# Patient Record
Sex: Female | Born: 1940 | Race: White | Hispanic: No | Marital: Married | Smoking: Never smoker
Health system: Southern US, Community
[De-identification: ages and names within clinical notes are randomized; demographics above are authoritative.]

## PROBLEM LIST (undated history)

## (undated) DIAGNOSIS — I10 Essential (primary) hypertension: Secondary | ICD-10-CM

## (undated) DIAGNOSIS — C4490 Unspecified malignant neoplasm of skin, unspecified: Secondary | ICD-10-CM

## (undated) HISTORY — PX: CHOLECYSTECTOMY: SHX55

## (undated) HISTORY — PX: ABDOMINAL HYSTERECTOMY: SHX81

---

## 1997-10-06 ENCOUNTER — Other Ambulatory Visit: Admission: RE | Admit: 1997-10-06 | Discharge: 1997-10-06 | Payer: Self-pay | Admitting: Gynecology

## 1997-10-14 ENCOUNTER — Other Ambulatory Visit: Admission: RE | Admit: 1997-10-14 | Discharge: 1997-10-14 | Payer: Self-pay | Admitting: Gynecology

## 1998-10-27 ENCOUNTER — Other Ambulatory Visit: Admission: RE | Admit: 1998-10-27 | Discharge: 1998-10-27 | Payer: Self-pay | Admitting: Gynecology

## 1998-12-18 ENCOUNTER — Inpatient Hospital Stay (HOSPITAL_COMMUNITY): Admission: RE | Admit: 1998-12-18 | Discharge: 1998-12-19 | Payer: Self-pay | Admitting: Gynecology

## 2000-02-21 ENCOUNTER — Emergency Department (HOSPITAL_COMMUNITY): Admission: EM | Admit: 2000-02-21 | Discharge: 2000-02-21 | Payer: Self-pay

## 2000-02-29 ENCOUNTER — Observation Stay (HOSPITAL_COMMUNITY): Admission: RE | Admit: 2000-02-29 | Discharge: 2000-03-01 | Payer: Self-pay | Admitting: Surgery

## 2000-02-29 ENCOUNTER — Encounter: Payer: Self-pay | Admitting: Surgery

## 2001-12-17 ENCOUNTER — Other Ambulatory Visit: Admission: RE | Admit: 2001-12-17 | Discharge: 2001-12-17 | Payer: Self-pay | Admitting: Gynecology

## 2002-12-27 ENCOUNTER — Other Ambulatory Visit: Admission: RE | Admit: 2002-12-27 | Discharge: 2002-12-27 | Payer: Self-pay | Admitting: Gynecology

## 2005-04-01 ENCOUNTER — Encounter: Admission: RE | Admit: 2005-04-01 | Discharge: 2005-04-01 | Payer: Self-pay | Admitting: Internal Medicine

## 2005-04-17 ENCOUNTER — Ambulatory Visit: Payer: Self-pay | Admitting: Internal Medicine

## 2005-04-29 ENCOUNTER — Ambulatory Visit: Payer: Self-pay | Admitting: Internal Medicine

## 2006-03-28 ENCOUNTER — Encounter: Admission: RE | Admit: 2006-03-28 | Discharge: 2006-03-28 | Payer: Self-pay | Admitting: Internal Medicine

## 2008-06-08 ENCOUNTER — Encounter: Payer: Self-pay | Admitting: Internal Medicine

## 2010-06-16 ENCOUNTER — Encounter: Payer: Self-pay | Admitting: Internal Medicine

## 2012-10-28 ENCOUNTER — Other Ambulatory Visit: Payer: Self-pay | Admitting: Internal Medicine

## 2012-10-28 DIAGNOSIS — R109 Unspecified abdominal pain: Secondary | ICD-10-CM

## 2012-11-02 ENCOUNTER — Ambulatory Visit
Admission: RE | Admit: 2012-11-02 | Discharge: 2012-11-02 | Disposition: A | Discharge status: Home / Self Care | Payer: PRIVATE HEALTH INSURANCE | Source: Ambulatory Visit | Attending: Internal Medicine | Admitting: Internal Medicine

## 2012-11-02 DIAGNOSIS — R109 Unspecified abdominal pain: Secondary | ICD-10-CM

## 2013-01-22 ENCOUNTER — Encounter: Payer: Self-pay | Admitting: Internal Medicine

## 2020-04-02 ENCOUNTER — Other Ambulatory Visit: Payer: Self-pay

## 2020-04-02 ENCOUNTER — Emergency Department (HOSPITAL_COMMUNITY): Payer: Medicare Other

## 2020-04-02 ENCOUNTER — Inpatient Hospital Stay (HOSPITAL_COMMUNITY)
Admission: EM | Admit: 2020-04-02 | Discharge: 2020-04-08 | DRG: 392 | Disposition: A | Discharge status: Home / Self Care | Payer: Medicare Other | Attending: General Surgery | Admitting: General Surgery

## 2020-04-02 ENCOUNTER — Encounter (HOSPITAL_COMMUNITY): Payer: Self-pay

## 2020-04-02 DIAGNOSIS — E876 Hypokalemia: Secondary | ICD-10-CM | POA: Diagnosis not present

## 2020-04-02 DIAGNOSIS — K5720 Diverticulitis of large intestine with perforation and abscess without bleeding: Secondary | ICD-10-CM | POA: Diagnosis present

## 2020-04-02 DIAGNOSIS — Z20822 Contact with and (suspected) exposure to covid-19: Secondary | ICD-10-CM | POA: Diagnosis present

## 2020-04-02 DIAGNOSIS — Z800 Family history of malignant neoplasm of digestive organs: Secondary | ICD-10-CM | POA: Diagnosis not present

## 2020-04-02 DIAGNOSIS — R1032 Left lower quadrant pain: Secondary | ICD-10-CM | POA: Diagnosis present

## 2020-04-02 DIAGNOSIS — Z881 Allergy status to other antibiotic agents status: Secondary | ICD-10-CM

## 2020-04-02 DIAGNOSIS — I10 Essential (primary) hypertension: Secondary | ICD-10-CM | POA: Diagnosis present

## 2020-04-02 HISTORY — DX: Essential (primary) hypertension: I10

## 2020-04-02 LAB — COMPREHENSIVE METABOLIC PANEL
ALT: 23 U/L (ref 0–44)
AST: 27 U/L (ref 15–41)
Albumin: 4.6 g/dL (ref 3.5–5.0)
Alkaline Phosphatase: 61 U/L (ref 38–126)
Anion gap: 10 (ref 5–15)
BUN: 14 mg/dL (ref 8–23)
CO2: 21 mmol/L — ABNORMAL LOW (ref 22–32)
Calcium: 9.4 mg/dL (ref 8.9–10.3)
Chloride: 100 mmol/L (ref 98–111)
Creatinine, Ser: 1.07 mg/dL — ABNORMAL HIGH (ref 0.44–1.00)
GFR, Estimated: 53 mL/min — ABNORMAL LOW (ref >60)
Glucose, Bld: 121 mg/dL — ABNORMAL HIGH (ref 70–99)
Potassium: 4.5 mmol/L (ref 3.5–5.1)
Sodium: 131 mmol/L — ABNORMAL LOW (ref 135–145)
Total Bilirubin: 1.6 mg/dL — ABNORMAL HIGH (ref 0.3–1.2)
Total Protein: 7.5 g/dL (ref 6.5–8.1)

## 2020-04-02 LAB — CBC
HCT: 44.7 % (ref 36.0–46.0)
Hemoglobin: 14.7 g/dL (ref 12.0–15.0)
MCH: 31.1 pg (ref 26.0–34.0)
MCHC: 32.9 g/dL (ref 30.0–36.0)
MCV: 94.5 fL (ref 80.0–100.0)
Platelets: 265 10*3/uL (ref 150–400)
RBC: 4.73 MIL/uL (ref 3.87–5.11)
RDW: 13.3 % (ref 11.5–15.5)
WBC: 20.2 10*3/uL — ABNORMAL HIGH (ref 4.0–10.5)
nRBC: 0 % (ref 0.0–0.2)

## 2020-04-02 LAB — URINALYSIS, ROUTINE W REFLEX MICROSCOPIC
Bacteria, UA: NONE SEEN
Bilirubin Urine: NEGATIVE
Glucose, UA: NEGATIVE mg/dL
Hgb urine dipstick: NEGATIVE
Ketones, ur: NEGATIVE mg/dL
Nitrite: NEGATIVE
Protein, ur: NEGATIVE mg/dL
Specific Gravity, Urine: 1.005 (ref 1.005–1.030)
pH: 7 (ref 5.0–8.0)

## 2020-04-02 LAB — RESPIRATORY PANEL BY RT PCR (FLU A&B, COVID)
Influenza A by PCR: NEGATIVE
Influenza B by PCR: NEGATIVE
SARS Coronavirus 2 by RT PCR: NEGATIVE

## 2020-04-02 LAB — LACTIC ACID, PLASMA: Lactic Acid, Venous: 0.9 mmol/L (ref 0.5–1.9)

## 2020-04-02 LAB — LIPASE, BLOOD: Lipase: 54 U/L — ABNORMAL HIGH (ref 11–51)

## 2020-04-02 MED ORDER — ONDANSETRON HCL 4 MG/2ML IJ SOLN
4.0000 mg | Freq: Once | INTRAMUSCULAR | Status: AC
  Administered 2020-04-02: 4 mg via INTRAVENOUS
  Filled 2020-04-02: qty 2

## 2020-04-02 MED ORDER — FENTANYL CITRATE (PF) 100 MCG/2ML IJ SOLN
50.0000 ug | Freq: Once | INTRAMUSCULAR | Status: AC
  Administered 2020-04-02: 50 ug via INTRAVENOUS
  Filled 2020-04-02: qty 2

## 2020-04-02 MED ORDER — SODIUM CHLORIDE 0.9 % IV BOLUS
1000.0000 mL | Freq: Once | INTRAVENOUS | Status: AC
  Administered 2020-04-02: 1000 mL via INTRAVENOUS

## 2020-04-02 MED ORDER — DIPHENHYDRAMINE HCL 50 MG/ML IJ SOLN
25.0000 mg | Freq: Once | INTRAMUSCULAR | Status: DC
  Filled 2020-04-02: qty 1

## 2020-04-02 MED ORDER — KCL IN DEXTROSE-NACL 20-5-0.9 MEQ/L-%-% IV SOLN
INTRAVENOUS | Status: DC
  Filled 2020-04-02 (×9): qty 1000

## 2020-04-02 MED ORDER — AMLODIPINE BESYLATE 5 MG PO TABS
5.0000 mg | ORAL_TABLET | Freq: Every day | ORAL | Status: DC
  Administered 2020-04-07 – 2020-04-08 (×2): 5 mg via ORAL
  Filled 2020-04-02 (×5): qty 1

## 2020-04-02 MED ORDER — IOHEXOL 300 MG/ML  SOLN
100.0000 mL | Freq: Once | INTRAMUSCULAR | Status: AC | PRN
  Administered 2020-04-02: 100 mL via INTRAVENOUS

## 2020-04-02 MED ORDER — PIPERACILLIN-TAZOBACTAM 3.375 G IVPB
3.3750 g | Freq: Three times a day (TID) | INTRAVENOUS | Status: DC
  Administered 2020-04-02 – 2020-04-08 (×17): 3.375 g via INTRAVENOUS
  Filled 2020-04-02 (×17): qty 50

## 2020-04-02 MED ORDER — ONDANSETRON HCL 4 MG/2ML IJ SOLN: 4.0000 mg | Freq: Four times a day (QID) | INTRAMUSCULAR | Status: DC | PRN

## 2020-04-02 MED ORDER — ONDANSETRON 4 MG PO TBDP: 4.0000 mg | ORAL_TABLET | Freq: Four times a day (QID) | ORAL | Status: DC | PRN

## 2020-04-02 MED ORDER — SODIUM CHLORIDE (PF) 0.9 % IJ SOLN
INTRAMUSCULAR | Status: AC
  Filled 2020-04-02: qty 50

## 2020-04-02 MED ORDER — PIPERACILLIN-TAZOBACTAM 3.375 G IVPB 30 MIN
3.3750 g | Freq: Once | INTRAVENOUS | Status: AC
  Administered 2020-04-02: 3.375 g via INTRAVENOUS
  Filled 2020-04-02: qty 50

## 2020-04-02 MED ORDER — MORPHINE SULFATE (PF) 2 MG/ML IV SOLN
1.0000 mg | INTRAVENOUS | Status: DC | PRN
  Administered 2020-04-02 – 2020-04-03 (×5): 2 mg via INTRAVENOUS
  Filled 2020-04-02 (×6): qty 1

## 2020-04-02 MED ORDER — SODIUM CHLORIDE 0.9 % IV BOLUS
500.0000 mL | Freq: Once | INTRAVENOUS | Status: AC
  Administered 2020-04-02: 500 mL via INTRAVENOUS

## 2020-04-02 MED ORDER — PANTOPRAZOLE SODIUM 40 MG IV SOLR
40.0000 mg | Freq: Every day | INTRAVENOUS | Status: DC
  Administered 2020-04-02 – 2020-04-04 (×3): 40 mg via INTRAVENOUS
  Filled 2020-04-02 (×3): qty 40

## 2020-04-02 NOTE — ED Notes (Signed)
Dr Toth at bedside. 

## 2020-04-02 NOTE — Progress Notes (Signed)
Patient ID: Sharon Callahan, female   DOB: 09/16/1940, 79 y.o.   MRN: [id number] Pt states she feels a little more comfortable. HR improving. On exam her abd is soft on right with occasional guarding. Very tender on left. Continue abx and monitor very closely

## 2020-04-02 NOTE — H&P (Signed)
Sharon Callahan is an 79 y.o. female.   Chief Complaint: abd pain HPI: The patient is a 79 year old white female who began having abdominal pain acutely last night.  The pain is mostly located in her left lower quadrant.  She did have a fever overnight to 103 at home.  She denies any nausea or vomiting.  She did have some loose stools yesterday.  She came to the emergency department today where a CT scan shows a couple dots of free air but no real free fluid in the abdomen.  There appears to be some inflammatory change around her proximal sigmoid colon.  Past Medical History:  Diagnosis Date  . Hypertension     Past Surgical History:  Procedure Laterality Date  . ABDOMINAL HYSTERECTOMY    . CHOLECYSTECTOMY      History reviewed. No pertinent family history. Social History:  reports that she has never smoked. She has never used smokeless tobacco. She reports that she does not drink alcohol and does not use drugs.  Allergies:  Allergies  Allergen Reactions  . Ciprofloxacin     (Not in a hospital admission)   Results for orders placed or performed during the hospital encounter of 04/02/20 (from the past 48 hour(s))  Lipase, blood     Status: Abnormal   Collection Time: 04/02/20  9:16 AM  Result Value Ref Range   Lipase 54 (H) 11 - 51 U/L    Comment: Performed at [facility], [address] [place], [place] [address]  Comprehensive metabolic panel     Status: Abnormal   Collection Time: 04/02/20  9:16 AM  Result Value Ref Range   Sodium 131 (L) 135 - 145 mmol/L   Potassium 4.5 3.5 - 5.1 mmol/L   Chloride 100 98 - 111 mmol/L   CO2 21 (L) 22 - 32 mmol/L   Glucose, Bld 121 (H) 70 - 99 mg/dL    Comment: Glucose reference range applies only to samples taken after fasting for at least 8 hours.   BUN 14 8 - 23 mg/dL   Creatinine, Ser 3.55 (H) 0.44 - 1.00 mg/dL   Calcium 9.4 8.9 - 73.2 mg/dL   Total Protein 7.5 6.5 - 8.1 g/dL   Albumin 4.6 3.5 - 5.0 g/dL   AST  27 15 - 41 U/L   ALT 23 0 - 44 U/L   Alkaline Phosphatase 61 38 - 126 U/L   Total Bilirubin 1.6 (H) 0.3 - 1.2 mg/dL   GFR, Estimated 53 (L) >60 mL/min    Comment: (NOTE) Calculated using the CKD-EPI Creatinine Equation (2021)    Anion gap 10 5 - 15    Comment: Performed at [facility], [address], [place], [place] [address]  CBC     Status: Abnormal   Collection Time: 04/02/20  9:16 AM  Result Value Ref Range   WBC 20.2 (H) 4.0 - 10.5 K/uL   RBC 4.73 3.87 - 5.11 MIL/uL   Hemoglobin 14.7 12.0 - 15.0 g/dL   HCT 27.0 36 - 46 %   MCV 94.5 80.0 - 100.0 fL   MCH 31.1 26.0 - 34.0 pg   MCHC 32.9 30.0 - 36.0 g/dL   RDW 62.3 76.2 - 83.1 %   Platelets 265 150 - 400 K/uL   nRBC 0.0 0.0 - 0.2 %    Comment: Performed at [facility], [address], [place], [place] [address]  Urinalysis, Routine w reflex microscopic Urine, Clean  Catch     Status: Abnormal   Collection Time: 04/02/20  9:16 AM  Result Value Ref Range   Color, Urine STRAW (A) YELLOW   APPearance CLEAR CLEAR   Specific Gravity, Urine 1.005 1.005 - 1.030   pH 7.0 5.0 - 8.0   Glucose, UA NEGATIVE NEGATIVE mg/dL   Hgb urine dipstick NEGATIVE NEGATIVE   Bilirubin Urine NEGATIVE NEGATIVE   Ketones, ur NEGATIVE NEGATIVE mg/dL   Protein, ur NEGATIVE NEGATIVE mg/dL   Nitrite NEGATIVE NEGATIVE   Leukocytes,Ua TRACE (A) NEGATIVE   RBC / HPF 0-5 0 - 5 RBC/hpf   WBC, UA 0-5 0 - 5 WBC/hpf   Bacteria, UA NONE SEEN NONE SEEN   Squamous Epithelial / LPF 0-5 0 - 5    Comment: Performed at [facility], [address] [place], [place] [address]  Lactic acid, plasma     Status: None   Collection Time: 04/02/20  9:58 AM  Result Value Ref Range   Lactic Acid, Venous 0.9 0.5 - 1.9 mmol/L    Comment: Performed at [facility], [address], [place], [place] [address]   CT ABDOMEN PELVIS W CONTRAST  Result Date: 04/02/2020 CLINICAL DATA:  Lower abdominal  pain since last night. EXAM: CT ABDOMEN AND PELVIS WITH CONTRAST TECHNIQUE: Multidetector CT imaging of the abdomen and pelvis was performed using the standard protocol following bolus administration of intravenous contrast. CONTRAST:  OMNIPAQUE IOHEXOL 300 MG/ML  SOLN COMPARISON:  CT abdomen pelvis 02/17/2014 FINDINGS: Lower chest: Small hiatal hernia. Hepatobiliary: No focal liver abnormality is seen. Status post cholecystectomy. No biliary dilatation. Pancreas: Unremarkable. No pancreatic ductal dilatation or surrounding inflammatory changes. Spleen: Normal in size without focal abnormality. Adrenals/Urinary Tract: Adrenal glands are unremarkable. Kidneys are normal, without renal calculi, focal lesion, or hydronephrosis. Bladder is unremarkable. Stomach/Bowel: Stomach is within normal limits. Appendix appears normal. There are multiple colonic diverticula and fat stranding about the sigmoid colon in the left lower quadrant. There are adjacent small foci of air as well as small volume distant free air in the upper abdomen. No pericolonic fluid collection. No significant bowel wall thickening. Vascular/Lymphatic: Aortic atherosclerosis. No enlarged abdominal or pelvic lymph nodes. Reproductive: Status post hysterectomy. No adnexal masses. Other: No abdominal wall hernia or abnormality. Musculoskeletal: Grade 1 anterolisthesis of L5 on S1 similar in appearance to the prior study and likely on a degenerative basis. IMPRESSION: 1. There is small volume free air in the abdomen consistent with perforation, most likely from a perforated diverticulum in the sigmoid colon give there is fat stranding and adjacent foci of gas in the left lower quadrant. 2.  Aortic atherosclerosis. Aortic Atherosclerosis (ICD10-I70.0). These results were called by telephone at the time of interpretation on 04/02/2020 at 11:25 am to provider [facility] , who verbally acknowledged these results. Electronically Signed   By: Emmaline Kluver M.D.   On: 04/02/2020 11:25    Review of Systems  Constitutional: Positive for fever.  HENT: Negative.   Eyes: Negative.   Respiratory: Negative.   Cardiovascular: Negative.   Gastrointestinal: Positive for abdominal pain and diarrhea. Negative for vomiting.  Endocrine: Negative.   Genitourinary: Negative.   Musculoskeletal: Negative.   Skin: Negative.   Allergic/Immunologic: Negative.   Neurological: Negative.   Hematological: Negative.   Psychiatric/Behavioral: Negative.     Blood pressure 124/79, pulse 94, temperature 98.2 F (36.8 C), temperature source Oral, resp. rate 16, SpO2 93 %. Physical Exam Constitutional:  General: She is not in acute distress.    Appearance: Normal appearance. She is normal weight.  HENT:     Head: Normocephalic and atraumatic.     Right Ear: External ear normal.     Left Ear: External ear normal.     Nose: Nose normal.     Mouth/Throat:     Mouth: Mucous membranes are moist.     Pharynx: Oropharynx is clear.  Eyes:     General: No scleral icterus.    Extraocular Movements: Extraocular movements intact.     Conjunctiva/sclera: Conjunctivae normal.     Pupils: Pupils are equal, round, and reactive to light.  Cardiovascular:     Rate and Rhythm: Regular rhythm. Tachycardia present.     Pulses: Normal pulses.     Heart sounds: Normal heart sounds.     Comments: No pitting edema lower extr Pulmonary:     Effort: Pulmonary effort is normal. No respiratory distress.     Breath sounds: Normal breath sounds.  Abdominal:     General: Abdomen is flat.     Tenderness: There is abdominal tenderness.     Comments: abd seems soft on right side but there is focal severe tenderness LLQ  Musculoskeletal:        General: No tenderness or deformity. Normal range of motion.     Cervical back: Normal range of motion. No tenderness.  Lymphadenopathy:     Cervical: No cervical adenopathy.  Skin:    General: Skin is warm and dry.      Coloration: Skin is not jaundiced.     Findings: No rash.  Neurological:     General: No focal deficit present.     Mental Status: She is alert and oriented to person, place, and time.  Psychiatric:        Mood and Affect: Mood normal.        Behavior: Behavior normal.        Thought Content: Thought content normal.      Assessment/Plan The patient appears to have a perforated sigmoid colon diverticulitis.  Since she does not have a lot of inflammatory change around the colon or fluid in the abdomen and because the right side of her abdomen seems soft I think it would be reasonable to try to treat her conservatively with broad-spectrum antibiotic therapy and bowel rest and watch her closely.  If she improves then she may avoid an acute surgery which would involve a colostomy.  If she does not improve then she may have to go to the operating room more urgently for a partial colectomy with colostomy.  I have discussed this in detail with her and she understands and agrees with this treatment plan.  She would also like to avoid surgery if at all possible.  Chevis Pretty III, MD 04/02/2020, 12:37 PM

## 2020-04-02 NOTE — Plan of Care (Signed)
Goals initiated  

## 2020-04-02 NOTE — ED Provider Notes (Signed)
[facility]-EMERGENCY DEPT Provider Note   CSN: [id number] Arrival date & time: 04/02/20  0825     History Chief Complaint  Patient presents with  . Abdominal Pain    Sharon Callahan is a 79 y.o. female.  Sharon Callahan is a 79 y.o. female with a history of hypertension, otherwise healthy, who presents to the ED for evaluation of left lower quadrant abdominal pain.  This pain began suddenly around 10 PM last night.  She reports since then it has been constant and worsening.  Pain kept her up most of the night.  She had 2 episodes of vomiting starting at 3 AM.  She states that yesterday during the day she began having some loose stools and diarrhea has continued today.  She denies any melena or hematochezia.  Reports chills throughout the night but no fevers noted.  She has never had similar pain.  Denies history of diverticulitis.  Has had previous cholecystectomy and abdominal hysterectomy, denies other abdominal surgeries.  No chest pain or shortness of breath, no cough, no recent illness.  No meds prior to arrival.  No other aggravating or alleviating factors.        Past Medical History:  Diagnosis Date  . Hypertension     There are no problems to display for this patient.   Past Surgical History:  Procedure Laterality Date  . ABDOMINAL HYSTERECTOMY    . CHOLECYSTECTOMY       OB History   No obstetric history on file.     History reviewed. No pertinent family history.  Social History   Tobacco Use  . Smoking status: Never Smoker  . Smokeless tobacco: Never Used  Substance Use Topics  . Alcohol use: Never  . Drug use: Never    Home Medications Prior to Admission medications   Medication Sig Start Date End Date Taking? Authorizing Provider  amLODipine (NORVASC) 5 MG tablet Take 5 mg by mouth daily. 03/07/20   [provider]  estradiol (ESTRACE) 0.5 MG tablet Take 0.5 mg by mouth daily. 03/06/20   [provider]   spironolactone (ALDACTONE) 25 MG tablet Take 25 mg by mouth daily. 03/12/20   [provider]    Allergies    Ciprofloxacin  Review of Systems   Review of Systems  Constitutional: Positive for chills. Negative for fever.  HENT: Negative.   Eyes: Negative for visual disturbance.  Respiratory: Negative for cough and shortness of breath.   Cardiovascular: Negative for chest pain.  Gastrointestinal: Positive for abdominal pain, diarrhea, nausea and vomiting. Negative for blood in stool and constipation.  Genitourinary: Negative for dysuria and frequency.  Musculoskeletal: Negative for arthralgias and myalgias.  Skin: Negative for color change and rash.  Neurological: Negative for dizziness, syncope and light-headedness.    Physical Exam Updated Vital Signs BP (!) 147/66 (BP Location: Left Arm)   Pulse 85   Temp 98.2 F (36.8 C) (Oral)   Resp 18   SpO2 99%   Physical Exam Vitals and nursing note reviewed.  Constitutional:      General: She is not in acute distress.    Appearance: She is well-developed. She is ill-appearing. She is not diaphoretic.     Comments: Patient appears uncomfortable and somewhat ill-appearing but in no acute distress  HENT:     Head: Normocephalic and atraumatic.     Mouth/Throat:     Mouth: Mucous membranes are moist.     Pharynx: Oropharynx is clear.  Eyes:  General:        Right eye: No discharge.        Left eye: No discharge.  Cardiovascular:     Rate and Rhythm: Normal rate and regular rhythm.     Heart sounds: Normal heart sounds.  Pulmonary:     Effort: Pulmonary effort is normal. No respiratory distress.     Breath sounds: Normal breath sounds. No wheezing or rales.     Comments: Respirations equal and unlabored, patient able to speak in full sentences, lungs clear to auscultation bilaterally Abdominal:     General: Bowel sounds are normal. There is no distension.     Palpations: Abdomen is soft. There is no mass.      Tenderness: There is abdominal tenderness in the left lower quadrant. There is guarding.     Comments: Abdomen is soft and nondistended, bowel sounds present throughout, patient is focally tender in the left lower quadrant, but with palpation in any other quadrant patient also experiences some pain in the left lower quadrant, she has guarding with tenderness.  Musculoskeletal:        General: No deformity.     Cervical back: Neck supple.  Skin:    General: Skin is warm and dry.     Capillary Refill: Capillary refill takes less than 2 seconds.  Neurological:     Mental Status: She is alert.     Coordination: Coordination normal.     Comments: Speech is clear, able to follow commands Moves extremities without ataxia, coordination intact  Psychiatric:        Mood and Affect: Mood normal.        Behavior: Behavior normal.     ED Results / Procedures / Treatments   Labs (all labs ordered are listed, but only abnormal results are displayed) Labs Reviewed  LIPASE, BLOOD - Abnormal; Notable for the following components:      Result Value   Lipase 54 (*)    All other components within normal limits  COMPREHENSIVE METABOLIC PANEL - Abnormal; Notable for the following components:   Sodium 131 (*)    CO2 21 (*)    Glucose, Bld 121 (*)    Creatinine, Ser 1.07 (*)    Total Bilirubin 1.6 (*)    GFR, Estimated 53 (*)    All other components within normal limits  CBC - Abnormal; Notable for the following components:   WBC 20.2 (*)    All other components within normal limits  URINALYSIS, ROUTINE W REFLEX MICROSCOPIC - Abnormal; Notable for the following components:   Color, Urine STRAW (*)    Leukocytes,Ua TRACE (*)    All other components within normal limits  RESPIRATORY PANEL BY RT PCR (FLU A&B, COVID)  LACTIC ACID, PLASMA  BASIC METABOLIC PANEL  CBC    EKG None  Radiology CT ABDOMEN PELVIS W CONTRAST  Result Date: 04/02/2020 CLINICAL DATA:  Lower abdominal pain since last  night. EXAM: CT ABDOMEN AND PELVIS WITH CONTRAST TECHNIQUE: Multidetector CT imaging of the abdomen and pelvis was performed using the standard protocol following bolus administration of intravenous contrast. CONTRAST:  OMNIPAQUE IOHEXOL 300 MG/ML  SOLN COMPARISON:  CT abdomen pelvis 02/17/2014 FINDINGS: Lower chest: Small hiatal hernia. Hepatobiliary: No focal liver abnormality is seen. Status post cholecystectomy. No biliary dilatation. Pancreas: Unremarkable. No pancreatic ductal dilatation or surrounding inflammatory changes. Spleen: Normal in size without focal abnormality. Adrenals/Urinary Tract: Adrenal glands are unremarkable. Kidneys are normal, without renal calculi, focal lesion, or hydronephrosis.  Bladder is unremarkable. Stomach/Bowel: Stomach is within normal limits. Appendix appears normal. There are multiple colonic diverticula and fat stranding about the sigmoid colon in the left lower quadrant. There are adjacent small foci of air as well as small volume distant free air in the upper abdomen. No pericolonic fluid collection. No significant bowel wall thickening. Vascular/Lymphatic: Aortic atherosclerosis. No enlarged abdominal or pelvic lymph nodes. Reproductive: Status post hysterectomy. No adnexal masses. Other: No abdominal wall hernia or abnormality. Musculoskeletal: Grade 1 anterolisthesis of L5 on S1 similar in appearance to the prior study and likely on a degenerative basis. IMPRESSION: 1. There is small volume free air in the abdomen consistent with perforation, most likely from a perforated diverticulum in the sigmoid colon give there is fat stranding and adjacent foci of gas in the left lower quadrant. 2.  Aortic atherosclerosis. Aortic Atherosclerosis (ICD10-I70.0). These results were called by telephone at the time of interpretation on 04/02/2020 at 11:25 am to provider [facility] , who verbally acknowledged these results. Electronically Signed   By: Emmaline Kluver M.D.   On:  04/02/2020 11:25    Procedures .Critical Care Performed by: Dartha Lodge, PA-C Authorized by: Dartha Lodge, PA-C   Critical care provider statement:    Critical care time (minutes):  45   Critical care time was exclusive of:  Separately billable procedures and treating other patients and teaching time   Critical care was necessary to treat or prevent imminent or life-threatening deterioration of the following conditions: Perforated diverticulitis.   Critical care was time spent personally by me on the following activities:  Discussions with consultants, evaluation of patient's response to treatment, examination of patient, ordering and performing treatments and interventions, ordering and review of laboratory studies, ordering and review of radiographic studies, pulse oximetry, re-evaluation of patient's condition, obtaining history from patient or surrogate and review of old charts   (including critical care time)  Medications Ordered in ED Medications  sodium chloride (PF) 0.9 % injection (has no administration in time range)  piperacillin-tazobactam (ZOSYN) IVPB 3.375 g (3.375 g Intravenous New Bag/Given 04/02/20 1139)  sodium chloride 0.9 % bolus 1,000 mL (0 mLs Intravenous Stopped 04/02/20 1139)  ondansetron (ZOFRAN) injection 4 mg (4 mg Intravenous Given 04/02/20 0957)  fentaNYL (SUBLIMAZE) injection 50 mcg (50 mcg Intravenous Given 04/02/20 0957)  iohexol (OMNIPAQUE) 300 MG/ML solution 100 mL (100 mLs Intravenous Contrast Given 04/02/20 1034)    ED Course  I have reviewed the triage vital signs and the nursing notes.  Pertinent labs & imaging results that were available during my care of the patient were reviewed by me and considered in my medical decision making (see chart for details).    MDM Rules/Calculators/A&P                         79 year old female presents with sudden onset left lower quadrant pain starting last night that has been severe and persistent associated  with vomiting and diarrhea, chills but no fever.  On arrival patient appears uncomfortable, but vitals are stable.  Differential includes diverticulitis, appendicitis, colitis, perforation, bowel obstruction.  Will evaluate with labs and CT, IV fluids, pain and nausea medication ordered.  I have independently ordered, reviewed and interpreted all labs and imaging: CBC: Significant leukocytosis of 20.2, normal hemoglobin CMP: Mild hyponatremia suggesting some dehydration, CO2 of 21, glucose 121, no other significant electrolyte derangements, creatinine of 1.07, mildly elevated bilirubin but no other significantly normal LFTs. Lipase:  Minimally elevated at 54 Lactic acid: Not elevated  Called by radiology regarding CT abdomen pelvis which shows perforation in the left lower quadrant most likely from diverticulitis, although there is not significant thickening of the bowel wall in this area there is some surrounding fat stranding and adjacent foci of gas.  Will start patient on IV Zosyn for perforation and consult general surgery.  Case discussed with Dr. Carolynne Edouardoth with general surgery who is reviewed patient's CT scan, agrees with Zosyn for antibiotic coverage and will be in to see and admit the patient.  Final Clinical Impression(s) / ED Diagnoses Final diagnoses:  Diverticulitis of colon with perforation    Rx / DC Orders ED Discharge Orders    None       Legrand RamsFord, Mckensey Berghuis N, PA-C 04/02/20 1858    Pollyann SavoySheldon, Charles B, MD 04/03/20 [phone]

## 2020-04-02 NOTE — ED Triage Notes (Signed)
Pt presents with c/o left lower quadrant pain since last night. Pt reports she did have 2 episodes of vomiting around 3 am.

## 2020-04-03 LAB — BASIC METABOLIC PANEL
Anion gap: 6 (ref 5–15)
BUN: 10 mg/dL (ref 8–23)
CO2: 20 mmol/L — ABNORMAL LOW (ref 22–32)
Calcium: 8.4 mg/dL — ABNORMAL LOW (ref 8.9–10.3)
Chloride: 109 mmol/L (ref 98–111)
Creatinine, Ser: 0.89 mg/dL (ref 0.44–1.00)
GFR, Estimated: >60 mL/min (ref >60)
Glucose, Bld: 118 mg/dL — ABNORMAL HIGH (ref 70–99)
Potassium: 4.2 mmol/L (ref 3.5–5.1)
Sodium: 135 mmol/L (ref 135–145)

## 2020-04-03 LAB — CBC
HCT: 37.5 % (ref 36.0–46.0)
Hemoglobin: 11.8 g/dL — ABNORMAL LOW (ref 12.0–15.0)
MCH: 30.7 pg (ref 26.0–34.0)
MCHC: 31.5 g/dL (ref 30.0–36.0)
MCV: 97.7 fL (ref 80.0–100.0)
Platelets: 212 10*3/uL (ref 150–400)
RBC: 3.84 MIL/uL — ABNORMAL LOW (ref 3.87–5.11)
RDW: 13.9 % (ref 11.5–15.5)
WBC: 12.9 10*3/uL — ABNORMAL HIGH (ref 4.0–10.5)
nRBC: 0 % (ref 0.0–0.2)

## 2020-04-03 MED ORDER — ACETAMINOPHEN 500 MG PO TABS
1000.0000 mg | ORAL_TABLET | Freq: Three times a day (TID) | ORAL | Status: DC
  Administered 2020-04-03 – 2020-04-08 (×13): 1000 mg via ORAL
  Filled 2020-04-03 (×15): qty 2

## 2020-04-03 MED ORDER — ENOXAPARIN SODIUM 40 MG/0.4ML ~~LOC~~ SOLN
40.0000 mg | Freq: Every day | SUBCUTANEOUS | Status: DC
  Administered 2020-04-03 – 2020-04-07 (×5): 40 mg via SUBCUTANEOUS
  Filled 2020-04-03 (×6): qty 0.4

## 2020-04-03 NOTE — Progress Notes (Addendum)
Subjective: CC:  Patient reports that her pain is similar to last night when she was re-examined by Dr. Carolynne Edouard. Overall her pain has improved since admission.   She reports 2 weeks of on/off LLQ abdominal pain leading up to Saturday where she had an acute episode of LLQ abdominal pain that made her double over. She reports associated fever and nausea at home.   She notes yesterday the pain became more generalized in the lower abdomen. The sharp pain has subsided and now she is having a dull/ache in her lower abdomen that is a 2/10 after pain medication and a 5/10 after the pain medication has worn off. She notes pain is worse when she tries to get up or walk around. She notes pressure/difficulty with urination but was able to this morning around 6am. No flatus or BM.   She notes prior colonoscopy 10 years ago without [facility]. She had a cologuard screen ~2 years ago. She does not remember this results. She denies prior bouts of Diverticulitis.    Objective: Vital signs in last 24 hours: Temp:  [98.4 F (36.9 C)-99.1 F (37.3 C)] 98.7 F (37.1 C) (11/08 0605) Pulse Rate:  [72-94] 77 (11/08 0605) Resp:  [16-20] 20 (11/08 0605) BP: (108-137)/(49-79) 130/66 (11/08 0605) SpO2:  [90 %-99 %] 94 % (11/08 0605) Weight:  [79.1 kg] 79.1 kg (11/08 0552) Last BM Date: 04/02/20  Intake/Output from previous day: 11/07 0701 - 11/08 0700 In: 974.5 [I.V.:874.5; IV Piggyback:100] Out: -  Intake/Output this shift: No intake/output data recorded.  PE: Gen:  Alert, NAD, pleasant Card:  RRR Pulm:  CTAB, no W/R/R, effort normal Abd: Soft, mild distension,   lower abdominal tenderness that is greatest in the LLQ>RLQ>suprapubic with some voluntary guarding. +BS Ext:  No LE edema  Psych: A&Ox3  Skin: no rashes noted, warm and dry   Lab Results:  Recent Labs    04/02/20 0916 04/03/20 0518  WBC 20.2* 12.9*  HGB 14.7 11.8*  HCT 44.7 37.5  PLT 265 212   BMET Recent Labs    04/02/20 0916  04/03/20 0518  NA 131* 135  K 4.5 4.2  CL 100 109  CO2 21* 20*  GLUCOSE 121* 118*  BUN 14 10  CREATININE 1.07* 0.89  CALCIUM 9.4 8.4*   PT/INR No results for input(s): LABPROT, INR in the last 72 hours. CMP     Component Value Date/Time   NA 135 04/03/2020 0518   K 4.2 04/03/2020 0518   CL 109 04/03/2020 0518   CO2 20 (L) 04/03/2020 0518   GLUCOSE 118 (H) 04/03/2020 0518   BUN 10 04/03/2020 0518   CREATININE 0.89 04/03/2020 0518   CALCIUM 8.4 (L) 04/03/2020 0518   PROT 7.5 04/02/2020 0916   ALBUMIN 4.6 04/02/2020 0916   AST 27 04/02/2020 0916   ALT 23 04/02/2020 0916   ALKPHOS 61 04/02/2020 0916   BILITOT 1.6 (H) 04/02/2020 0916   GFRNONAA >60 04/03/2020 0518   Lipase     Component Value Date/Time   LIPASE 54 (H) 04/02/2020 0916       Studies/Results: CT ABDOMEN PELVIS W CONTRAST  Result Date: 04/02/2020 CLINICAL DATA:  Lower abdominal pain since last night. EXAM: CT ABDOMEN AND PELVIS WITH CONTRAST TECHNIQUE: Multidetector CT imaging of the abdomen and pelvis was performed using the standard protocol following bolus administration of intravenous contrast. CONTRAST:  OMNIPAQUE IOHEXOL 300 MG/ML  SOLN COMPARISON:  CT abdomen pelvis 02/17/2014 FINDINGS: Lower chest:  Small hiatal hernia. Hepatobiliary: No focal liver abnormality is seen. Status post cholecystectomy. No biliary dilatation. Pancreas: Unremarkable. No pancreatic ductal dilatation or surrounding inflammatory changes. Spleen: Normal in size without focal abnormality. Adrenals/Urinary Tract: Adrenal glands are unremarkable. Kidneys are normal, without renal calculi, focal lesion, or hydronephrosis. Bladder is unremarkable. Stomach/Bowel: Stomach is within normal limits. Appendix appears normal. There are multiple colonic diverticula and fat stranding about the sigmoid colon in the left lower quadrant. There are adjacent small foci of air as well as small volume distant free air in the upper abdomen. No  pericolonic fluid collection. No significant bowel wall thickening. Vascular/Lymphatic: Aortic atherosclerosis. No enlarged abdominal or pelvic lymph nodes. Reproductive: Status post hysterectomy. No adnexal masses. Other: No abdominal wall hernia or abnormality. Musculoskeletal: Grade 1 anterolisthesis of L5 on S1 similar in appearance to the prior study and likely on a degenerative basis. IMPRESSION: 1. There is small volume free air in the abdomen consistent with perforation, most likely from a perforated diverticulum in the sigmoid colon give there is fat stranding and adjacent foci of gas in the left lower quadrant. 2.  Aortic atherosclerosis. Aortic Atherosclerosis (ICD10-I70.0). These results were called by telephone at the time of interpretation on 04/02/2020 at 11:25 am to provider [facility] , who verbally acknowledged these results. Electronically Signed   By: Emmaline Kluver M.D.   On: 04/02/2020 11:25    Anti-infectives: Anti-infectives (From admission, onward)   Start     Dose/Rate Route Frequency Ordered Stop   04/02/20 2000  piperacillin-tazobactam (ZOSYN) IVPB 3.375 g        3.375 g 12.5 mL/hr over 240 Minutes Intravenous Every 8 hours 04/02/20 1432     04/02/20 1130  piperacillin-tazobactam (ZOSYN) IVPB 3.375 g        3.375 g 100 mL/hr over 30 Minutes Intravenous  Once 04/02/20 1127 04/02/20 1307       Assessment/Plan HTN  Perforated Sigmoid Diverticulitis  - CT 11/7 w/ small volume free air in the abdomen consistent with perforation, most likely from a perforated diverticulum in the sigmoid colon give there is fat stranding and adjacent foci of gas in the left lower quadrant. No drainable abscess identified.  - Her abdominal pain has improved since admission. She notes it is similar to last night. She is afebrile without tachycardia, or hypotension this morning. WBC improved from 20 > 12.9. Continue conservative treatments with bowel rest and abx.   She is still quite  tender on exam so she will need to be watched closely over the next 24 hours. I discussed with her that if she does not continue to improve, she may require an ex-lap that would likely result in a colostomy.   FEN - NPO, IVF VTE - SCDs, Lovenox ID - Zosyn    LOS: 1 day    Jacinto Halim , [facility] Surgery 04/03/2020, 9:45 AM Please see Amion for pager number during day hours 7:00am-4:30pm  Agree with above. Her labs are better.  Ovidio Kin, MD, [facility] Surgery Office phone:  [phone]

## 2020-04-03 NOTE — Progress Notes (Signed)
Chaplain initiated visit with patient and her husband. Chaplain established a relationship of care and concern while listening to concerns patient had about infection and future surgery.  Patient requested prayers for infection to be gone, surgery to be minor and for a quick recovery. Chaplain prayed with patient and husband.  Patient has strong faith and a caring church community. Patient expresses concern that once she notified her pastor of being in the hospital and word gets out that her phone will not stop ringing.  Patient expressed some concern over this possibility.  Patient has substantial support from her husband.  Chaplain will visit again on Wednesday.  Vernell Morgans Staff Chaplain

## 2020-04-04 LAB — BASIC METABOLIC PANEL
Anion gap: 5 (ref 5–15)
BUN: 8 mg/dL (ref 8–23)
CO2: 23 mmol/L (ref 22–32)
Calcium: 8.8 mg/dL — ABNORMAL LOW (ref 8.9–10.3)
Chloride: 109 mmol/L (ref 98–111)
Creatinine, Ser: 0.9 mg/dL (ref 0.44–1.00)
GFR, Estimated: >60 mL/min (ref >60)
Glucose, Bld: 88 mg/dL (ref 70–99)
Potassium: 3.3 mmol/L — ABNORMAL LOW (ref 3.5–5.1)
Sodium: 137 mmol/L (ref 135–145)

## 2020-04-04 LAB — CBC
HCT: 39.9 % (ref 36.0–46.0)
Hemoglobin: 12.6 g/dL (ref 12.0–15.0)
MCH: 30.6 pg (ref 26.0–34.0)
MCHC: 31.6 g/dL (ref 30.0–36.0)
MCV: 96.8 fL (ref 80.0–100.0)
Platelets: 214 10*3/uL (ref 150–400)
RBC: 4.12 MIL/uL (ref 3.87–5.11)
RDW: 13.7 % (ref 11.5–15.5)
WBC: 9.6 10*3/uL (ref 4.0–10.5)
nRBC: 0 % (ref 0.0–0.2)

## 2020-04-04 MED ORDER — POTASSIUM CHLORIDE CRYS ER 10 MEQ PO TBCR
30.0000 meq | EXTENDED_RELEASE_TABLET | Freq: Once | ORAL | Status: AC
  Administered 2020-04-04: 30 meq via ORAL
  Filled 2020-04-04: qty 3

## 2020-04-04 MED ORDER — OXYCODONE HCL 5 MG PO TABS: 5.0000 mg | ORAL_TABLET | ORAL | Status: DC | PRN

## 2020-04-04 NOTE — Progress Notes (Addendum)
Subjective: CC:  Patient notes that her RLQ abdominal pain has resolved. She continues to have constant LLQ abdominal pain that is similar to yesterday and rates as a 4/10. She notes night sweats overnight. No fever, chills, n/v. She is not passing flatus and has not had a BM.   Objective: Vital signs in last 24 hours: Temp:  [97.5 F (36.4 C)-98.9 F (37.2 C)] 98.1 F (36.7 C) (11/09 0610) Pulse Rate:  [69-80] 69 (11/09 0610) Resp:  [18-20] 18 (11/09 0610) BP: (110-135)/(71-76) 135/76 (11/09 0610) SpO2:  [94 %-96 %] 96 % (11/09 0610) Last BM Date: 04/02/20  Intake/Output from previous day: 11/08 0701 - 11/09 0700 In: 1388.2 [I.V.:1240.3; IV Piggyback:147.9] Out: -  Intake/Output this shift: No intake/output data recorded.  PE: Gen:  Alert, NAD, pleasant Card:  RRR Pulm:  CTAB, no W/R/R, effort normal Abd: Soft, mild distension.   LLQ abdominal tenderness that has improved. Some voluntary guarding with deep palpation of the LLQ. Abdomen otherwise NT. +BS Ext:  No LE edema  Psych: A&Ox3  Skin: no rashes noted, warm and dry   Lab Results:  Recent Labs    04/03/20 0518 04/04/20 0608  WBC 12.9* 9.6  HGB 11.8* 12.6  HCT 37.5 39.9  PLT 212 214   BMET Recent Labs    04/03/20 0518 04/04/20 0608  NA 135 137  K 4.2 3.3*  CL 109 109  CO2 20* 23  GLUCOSE 118* 88  BUN 10 8  CREATININE 0.89 0.90  CALCIUM 8.4* 8.8*   PT/INR No results for input(s): LABPROT, INR in the last 72 hours. CMP     Component Value Date/Time   NA 137 04/04/2020 0608   K 3.3 (L) 04/04/2020 0608   CL 109 04/04/2020 0608   CO2 23 04/04/2020 0608   GLUCOSE 88 04/04/2020 0608   BUN 8 04/04/2020 0608   CREATININE 0.90 04/04/2020 0608   CALCIUM 8.8 (L) 04/04/2020 0608   PROT 7.5 04/02/2020 0916   ALBUMIN 4.6 04/02/2020 0916   AST 27 04/02/2020 0916   ALT 23 04/02/2020 0916   ALKPHOS 61 04/02/2020 0916   BILITOT 1.6 (H) 04/02/2020 0916   GFRNONAA >60 04/04/2020 0608   Lipase      Component Value Date/Time   LIPASE 54 (H) 04/02/2020 0916       Studies/Results: CT ABDOMEN PELVIS W CONTRAST  Result Date: 04/02/2020 CLINICAL DATA:  Lower abdominal pain since last night. EXAM: CT ABDOMEN AND PELVIS WITH CONTRAST TECHNIQUE: Multidetector CT imaging of the abdomen and pelvis was performed using the standard protocol following bolus administration of intravenous contrast. CONTRAST:  OMNIPAQUE IOHEXOL 300 MG/ML  SOLN COMPARISON:  CT abdomen pelvis 02/17/2014 FINDINGS: Lower chest: Small hiatal hernia. Hepatobiliary: No focal liver abnormality is seen. Status post cholecystectomy. No biliary dilatation. Pancreas: Unremarkable. No pancreatic ductal dilatation or surrounding inflammatory changes. Spleen: Normal in size without focal abnormality. Adrenals/Urinary Tract: Adrenal glands are unremarkable. Kidneys are normal, without renal calculi, focal lesion, or hydronephrosis. Bladder is unremarkable. Stomach/Bowel: Stomach is within normal limits. Appendix appears normal. There are multiple colonic diverticula and fat stranding about the sigmoid colon in the left lower quadrant. There are adjacent small foci of air as well as small volume distant free air in the upper abdomen. No pericolonic fluid collection. No significant bowel wall thickening. Vascular/Lymphatic: Aortic atherosclerosis. No enlarged abdominal or pelvic lymph nodes. Reproductive: Status post hysterectomy. No adnexal masses. Other: No abdominal wall hernia or  abnormality. Musculoskeletal: Grade 1 anterolisthesis of L5 on S1 similar in appearance to the prior study and likely on a degenerative basis. IMPRESSION: 1. There is small volume free air in the abdomen consistent with perforation, most likely from a perforated diverticulum in the sigmoid colon give there is fat stranding and adjacent foci of gas in the left lower quadrant. 2.  Aortic atherosclerosis. Aortic Atherosclerosis (ICD10-I70.0). These results were  called by telephone at the time of interpretation on 04/02/2020 at 11:25 am to provider [facility] , who verbally acknowledged these results. Electronically Signed   By: Emmaline Kluver M.D.   On: 04/02/2020 11:25    Anti-infectives: Anti-infectives (From admission, onward)   Start     Dose/Rate Route Frequency Ordered Stop   04/02/20 2000  piperacillin-tazobactam (ZOSYN) IVPB 3.375 g        3.375 g 12.5 mL/hr over 240 Minutes Intravenous Every 8 hours 04/02/20 1432     04/02/20 1130  piperacillin-tazobactam (ZOSYN) IVPB 3.375 g        3.375 g 100 mL/hr over 30 Minutes Intravenous  Once 04/02/20 1127 04/02/20 1307       Assessment/Plan HTN - home meds   Perforated Sigmoid Diverticulitis  - CT 11/7 w/ small volume free air in the abdomen consistent with perforation, most likely from a perforated diverticulum in the sigmoid colon give there is fat stranding and adjacent foci of gas in the left lower quadrant. No drainable abscess identified.  - Her abdominal pain has improved since admission. She is afebrile without tachycardia, or hypotension this morning.  Leukocytosis has resolved this AM with WBC 9.6. Continue conservative treatments with bowel rest and abx. Hopefully patient will continue to improve with conservative management. We did discuss previously that if she does not continue to improve, she may require an ex-lap that would likely result in a colostomy.   FEN - NPO, IVF VTE - SCDs, Lovenox ID - Zosyn   LOS: 2 days    Jacinto Halim , [facility] Surgery 04/04/2020, 8:12 AM Please see Amion for pager number during day hours 7:00am-4:30pm  Agree with above. She is improving with conservative treatment, though still tender in her lower abdomen. Her husband is in the room. She needs to ambulate.  For some reason, she has not walked in the hall since she has been here. Continue NPO.  Ovidio Kin, MD, [facility] Surgery Office phone:   [phone]

## 2020-04-05 LAB — BASIC METABOLIC PANEL
Anion gap: 7 (ref 5–15)
BUN: 10 mg/dL (ref 8–23)
CO2: 20 mmol/L — ABNORMAL LOW (ref 22–32)
Calcium: 8.7 mg/dL — ABNORMAL LOW (ref 8.9–10.3)
Chloride: 108 mmol/L (ref 98–111)
Creatinine, Ser: 0.76 mg/dL (ref 0.44–1.00)
GFR, Estimated: >60 mL/min (ref >60)
Glucose, Bld: 87 mg/dL (ref 70–99)
Potassium: 3.7 mmol/L (ref 3.5–5.1)
Sodium: 135 mmol/L (ref 135–145)

## 2020-04-05 LAB — CBC
HCT: 39.9 % (ref 36.0–46.0)
Hemoglobin: 12.8 g/dL (ref 12.0–15.0)
MCH: 30.5 pg (ref 26.0–34.0)
MCHC: 32.1 g/dL (ref 30.0–36.0)
MCV: 95 fL (ref 80.0–100.0)
Platelets: 232 10*3/uL (ref 150–400)
RBC: 4.2 MIL/uL (ref 3.87–5.11)
RDW: 13.4 % (ref 11.5–15.5)
WBC: 6.6 10*3/uL (ref 4.0–10.5)
nRBC: 0 % (ref 0.0–0.2)

## 2020-04-05 MED ORDER — PANTOPRAZOLE SODIUM 40 MG PO TBEC
40.0000 mg | DELAYED_RELEASE_TABLET | Freq: Every day | ORAL | Status: DC
  Administered 2020-04-05 – 2020-04-07 (×3): 40 mg via ORAL
  Filled 2020-04-05 (×3): qty 1

## 2020-04-05 NOTE — Progress Notes (Addendum)
Subjective: CC:  Patient reports that her abdominal pain has resolved. No n/v. Passing flatus. Had a loose BM last night. Had some night sweats again last night. Afebrile overnight. Denies chills.  She is mobilizing in the halls without any pain. No sob. Pulling 1500 on IS.   Objective: Vital signs in last 24 hours: Temp:  [97.5 F (36.4 C)-97.8 F (36.6 C)] 97.5 F (36.4 C) (11/10 0313) Pulse Rate:  [67-72] 67 (11/10 0313) Resp:  [18-20] 20 (11/10 0313) BP: (138-149)/(75-78) 149/75 (11/10 0313) SpO2:  [97 %-100 %] 100 % (11/10 0313) Last BM Date: 04/03/20  Intake/Output from previous day: 11/09 0701 - 11/10 0700 In: 815.3 [I.V.:717.9; IV Piggyback:97.4] Out: -  Intake/Output this shift: No intake/output data recorded.  PE: Gen:  Alert, NAD, pleasant Card:  RRR, no M/G/R heard Pulm:  CTAB, no W/R/R, effort normal Abd: Soft, ND, very mild tenderness of the LLQ that is improved from yesterday.   No peritonitis.+BS Ext:  No LE edema  Psych: A&Ox3  Skin: no rashes noted, warm and dry  Lab Results:  Recent Labs    04/04/20 0608 04/05/20 0527  WBC 9.6 6.6  HGB 12.6 12.8  HCT 39.9 39.9  PLT 214 232   BMET Recent Labs    04/04/20 0608 04/05/20 0527  NA 137 135  K 3.3* 3.7  CL 109 108  CO2 23 20*  GLUCOSE 88 87  BUN 8 10  CREATININE 0.90 0.76  CALCIUM 8.8* 8.7*   PT/INR No results for input(s): LABPROT, INR in the last 72 hours. CMP     Component Value Date/Time   NA 135 04/05/2020 0527   K 3.7 04/05/2020 0527   CL 108 04/05/2020 0527   CO2 20 (L) 04/05/2020 0527   GLUCOSE 87 04/05/2020 0527   BUN 10 04/05/2020 0527   CREATININE 0.76 04/05/2020 0527   CALCIUM 8.7 (L) 04/05/2020 0527   PROT 7.5 04/02/2020 0916   ALBUMIN 4.6 04/02/2020 0916   AST 27 04/02/2020 0916   ALT 23 04/02/2020 0916   ALKPHOS 61 04/02/2020 0916   BILITOT 1.6 (H) 04/02/2020 0916   GFRNONAA >60 04/05/2020 0527   Lipase     Component Value Date/Time   LIPASE 54 (H)  04/02/2020 0916       Studies/Results: No results found.  Anti-infectives: Anti-infectives (From admission, onward)   Start     Dose/Rate Route Frequency Ordered Stop   04/02/20 2000  piperacillin-tazobactam (ZOSYN) IVPB 3.375 g        3.375 g 12.5 mL/hr over 240 Minutes Intravenous Every 8 hours 04/02/20 1432     04/02/20 1130  piperacillin-tazobactam (ZOSYN) IVPB 3.375 g        3.375 g 100 mL/hr over 30 Minutes Intravenous  Once 04/02/20 1127 04/02/20 1307       Assessment/Plan HTN - home meds   Perforated Sigmoid Diverticulitis             - CT 11/7 w/ small volume free air in the abdomen consistent with perforation, most likely from a perforated diverticulum in the sigmoid colon give there is fat stranding and adjacent foci of gas in the left lower quadrant.No drainable abscess identified.  - WBC has normalized. Afebrile overnight. Pain has resolved and her exam has improved. Allow clears today. Hopefully patient will continue to improve with conservative management. AM labs   FEN - CLD VTE - SCDs, Lovenox  ID - Zosyn   LOS: 3  days    Jacinto Halim , [facility] Surgery 04/05/2020, 10:08 AM Please see Amion for pager number during day hours 7:00am-4:30pm  Agree with above. Clearly feeling better every day.  Tolerated liquids.  Ovidio Kin, MD, [facility] Surgery Office phone:  [phone]

## 2020-04-05 NOTE — Progress Notes (Signed)

## 2020-04-05 NOTE — Progress Notes (Signed)
Follow up visit by Chaplain for continuity of spiritual care for this patient.  Patient and patient's husband in the room.  Re-established a ministry of presence with patient. Patient hoping to be discharged over the weekend. Patient's phone rang, Assessing the call was important, the Chaplain departed.    Vernell Morgans Chaplain

## 2020-04-06 LAB — CBC
HCT: 38.7 % (ref 36.0–46.0)
Hemoglobin: 12.5 g/dL (ref 12.0–15.0)
MCH: 30.7 pg (ref 26.0–34.0)
MCHC: 32.3 g/dL (ref 30.0–36.0)
MCV: 95.1 fL (ref 80.0–100.0)
Platelets: 250 10*3/uL (ref 150–400)
RBC: 4.07 MIL/uL (ref 3.87–5.11)
RDW: 13.4 % (ref 11.5–15.5)
WBC: 5.1 10*3/uL (ref 4.0–10.5)
nRBC: 0 % (ref 0.0–0.2)

## 2020-04-06 LAB — BASIC METABOLIC PANEL
Anion gap: 7 (ref 5–15)
BUN: 6 mg/dL — ABNORMAL LOW (ref 8–23)
CO2: 20 mmol/L — ABNORMAL LOW (ref 22–32)
Calcium: 8.7 mg/dL — ABNORMAL LOW (ref 8.9–10.3)
Chloride: 109 mmol/L (ref 98–111)
Creatinine, Ser: 0.71 mg/dL (ref 0.44–1.00)
GFR, Estimated: >60 mL/min (ref >60)
Glucose, Bld: 104 mg/dL — ABNORMAL HIGH (ref 70–99)
Potassium: 3.9 mmol/L (ref 3.5–5.1)
Sodium: 136 mmol/L (ref 135–145)

## 2020-04-06 NOTE — Progress Notes (Deleted)
Patient had 7 loose BM during shift. Pt stated she spoke to the doctor with concerns. Pt Will continue to monitor.

## 2020-04-06 NOTE — Progress Notes (Signed)
Patient had 7 loose BM during shift. Dr is aware. Will continue to monitor.

## 2020-04-06 NOTE — Progress Notes (Addendum)
Subjective: CC:  Doing well. Tolerating cld without n/v. No abdominal pain yesterday. This morning around 830am, started having a throbbing in her LLQ that would last 2-3 minutes and is rated as a 3/10. No aggravating factors. This does not worsen with eating.  Objective: Vital signs in last 24 hours: Temp:  [97.7 F (36.5 C)-97.9 F (36.6 C)] 97.8 F (36.6 C) (11/11 0849) Pulse Rate:  [60-73] 72 (11/11 0957) Resp:  [16-20] 18 (11/11 0849) BP: (137-163)/(73-96) 156/84 (11/11 0957) SpO2:  [96 %-99 %] 99 % (11/11 0849) Last BM Date: 04/05/20  Intake/Output from previous day: 11/10 0701 - 11/11 0700 In: 990 [P.O.:240; I.V.:700; IV Piggyback:50] Out: 1 [Stool:1] Intake/Output this shift: No intake/output data recorded.  PE: Gen:  Alert, NAD, pleasant Card:  RRR, no M/G/R heard Pulm:  CTAB, no W/R/R, effort normal Abd: Soft, ND, tenderness of the LLQ without peritonitis. +BS Ext:  No LE edema  Psych: A&Ox3  Skin: no rashes noted, warm and dry  Lab Results:  Recent Labs    04/05/20 0527 04/06/20 0508  WBC 6.6 5.1  HGB 12.8 12.5  HCT 39.9 38.7  PLT 232 250   BMET Recent Labs    04/05/20 0527 04/06/20 0508  NA 135 136  K 3.7 3.9  CL 108 109  CO2 20* 20*  GLUCOSE 87 104*  BUN 10 6*  CREATININE 0.76 0.71  CALCIUM 8.7* 8.7*   PT/INR No results for input(s): LABPROT, INR in the last 72 hours. CMP     Component Value Date/Time   NA 136 04/06/2020 0508   K 3.9 04/06/2020 0508   CL 109 04/06/2020 0508   CO2 20 (L) 04/06/2020 0508   GLUCOSE 104 (H) 04/06/2020 0508   BUN 6 (L) 04/06/2020 0508   CREATININE 0.71 04/06/2020 0508   CALCIUM 8.7 (L) 04/06/2020 0508   PROT 7.5 04/02/2020 0916   ALBUMIN 4.6 04/02/2020 0916   AST 27 04/02/2020 0916   ALT 23 04/02/2020 0916   ALKPHOS 61 04/02/2020 0916   BILITOT 1.6 (H) 04/02/2020 0916   GFRNONAA >60 04/06/2020 0508   Lipase     Component Value Date/Time   LIPASE 54 (H) 04/02/2020 0916        Studies/Results: No results found.  Anti-infectives: Anti-infectives (From admission, onward)   Start     Dose/Rate Route Frequency Ordered Stop   04/02/20 2000  piperacillin-tazobactam (ZOSYN) IVPB 3.375 g        3.375 g 12.5 mL/hr over 240 Minutes Intravenous Every 8 hours 04/02/20 1432     04/02/20 1130  piperacillin-tazobactam (ZOSYN) IVPB 3.375 g        3.375 g 100 mL/hr over 30 Minutes Intravenous  Once 04/02/20 1127 04/02/20 1307       Assessment/Plan HTN- home meds. Elevated overnight. Monitor.   Perforated Sigmoid Diverticulitis - CT 11/7 w/ small volume free air in the abdomen consistent with perforation, most likely from a perforated diverticulum in the sigmoid colon give there is fat stranding and adjacent foci of gas in the left lower quadrant.No drainable abscess identified.             - WBC has normalized. Afebrile overnight. Tolerating cld. Slight increase in pain today. Will leave on clears. Hopefully patient will continue to improve with conservative management. AM labs   FEN - CLD VTE - SCDs, Lovenox  ID - Zosyn   LOS: 4 days    Jacinto Halim , PA-C  [facility] 04/06/2020, 11:05 AM Please see Amion for pager number during day hours 7:00am-4:30pm  Agree with above. Some pain today, but that has cleared, and she is doing fine now. On clear liquids.  Ovidio Kin, MD, [facility] Surgery Office phone:  [phone]

## 2020-04-06 NOTE — Care Management Important Message (Signed)
Important Message  Patient Details IM Letter given to the Patient Name: Sharon Callahan MRN: [id number] Date of Birth: 03/18/1941   Medicare Important Message Given:  Yes     [name], [name] 04/06/2020, 10:44 AM

## 2020-04-07 LAB — BASIC METABOLIC PANEL WITH GFR
Anion gap: 6 (ref 5–15)
BUN: <5 mg/dL — ABNORMAL LOW (ref 8–23)
CO2: 22 mmol/L (ref 22–32)
Calcium: 9 mg/dL (ref 8.9–10.3)
Chloride: 112 mmol/L — ABNORMAL HIGH (ref 98–111)
Creatinine, Ser: 0.88 mg/dL (ref 0.44–1.00)
GFR, Estimated: >60 mL/min
Glucose, Bld: 99 mg/dL (ref 70–99)
Potassium: 3.8 mmol/L (ref 3.5–5.1)
Sodium: 140 mmol/L (ref 135–145)

## 2020-04-07 LAB — CBC
HCT: 38.3 % (ref 36.0–46.0)
Hemoglobin: 12.2 g/dL (ref 12.0–15.0)
MCH: 30.3 pg (ref 26.0–34.0)
MCHC: 31.9 g/dL (ref 30.0–36.0)
MCV: 95 fL (ref 80.0–100.0)
Platelets: 248 K/uL (ref 150–400)
RBC: 4.03 MIL/uL (ref 3.87–5.11)
RDW: 13.6 % (ref 11.5–15.5)
WBC: 4.7 K/uL (ref 4.0–10.5)
nRBC: 0 % (ref 0.0–0.2)

## 2020-04-07 NOTE — Progress Notes (Signed)
Subjective: CC:  Doing well. She now has no pain.  She is having multiple loose BM's.  We talked about follow up and the possibility of surgery.   She had a sister who died of colon cancer in her 15's.   But she had a friend who died from a perforation of a colonoscopy (in [place]).  So she has not had a colonoscopy in 10+ years.  Objective: Vital signs in last 24 hours: Temp:  [97.6 F (36.4 C)-98.1 F (36.7 C)] 98.1 F (36.7 C) (11/12 0524) Pulse Rate:  [69-82] 69 (11/12 0524) Resp:  [12-18] 16 (11/12 0524) BP: (143-158)/(72-91) 144/72 (11/12 0524) SpO2:  [94 %-98 %] 97 % (11/12 0524) Last BM Date: 04/06/20  Intake/Output from previous day: 11/11 0701 - 11/12 0700 In: 1460 [P.O.:960; I.V.:450; IV Piggyback:50] Out: -  Intake/Output this shift: Total I/O In: 240 [P.O.:240] Out: -   PE: Gen:  Alert, NAD, pleasant Card:  RRR, no M/G/R heard Pulm:  CTAB, no W/R/R, effort normal Abd: Soft, ND, +BS  Her tenderness has completely resolved. Ext:  No LE edema    Lab Results:  Recent Labs    04/06/20 0508 04/07/20 0459  WBC 5.1 4.7  HGB 12.5 12.2  HCT 38.7 38.3  PLT 250 248   BMET Recent Labs    04/06/20 0508 04/07/20 0459  NA 136 140  K 3.9 3.8  CL 109 112*  CO2 20* 22  GLUCOSE 104* 99  BUN 6* <5*  CREATININE 0.71 0.88  CALCIUM 8.7* 9.0   PT/INR No results for input(s): LABPROT, INR in the last 72 hours. CMP     Component Value Date/Time   NA 140 04/07/2020 0459   K 3.8 04/07/2020 0459   CL 112 (H) 04/07/2020 0459   CO2 22 04/07/2020 0459   GLUCOSE 99 04/07/2020 0459   BUN <5 (L) 04/07/2020 0459   CREATININE 0.88 04/07/2020 0459   CALCIUM 9.0 04/07/2020 0459   PROT 7.5 04/02/2020 0916   ALBUMIN 4.6 04/02/2020 0916   AST 27 04/02/2020 0916   ALT 23 04/02/2020 0916   ALKPHOS 61 04/02/2020 0916   BILITOT 1.6 (H) 04/02/2020 0916   GFRNONAA >60 04/07/2020 0459   Lipase     Component Value Date/Time   LIPASE 54 (H) 04/02/2020 0916        Studies/Results: No results found.  Anti-infectives: Anti-infectives (From admission, onward)   Start     Dose/Rate Route Frequency Ordered Stop   04/02/20 2000  piperacillin-tazobactam (ZOSYN) IVPB 3.375 g        3.375 g 12.5 mL/hr over 240 Minutes Intravenous Every 8 hours 04/02/20 1432     04/02/20 1130  piperacillin-tazobactam (ZOSYN) IVPB 3.375 g        3.375 g 100 mL/hr over 30 Minutes Intravenous  Once 04/02/20 1127 04/02/20 1307       Assessment/Plan 1.  HTN  2.  Perforated Sigmoid Diverticulitis - CT 11/7 w/ small volume free air in the abdomen consistent with perforation, most likely from a perforated diverticulum in the sigmoid colon give there is fat stranding and adjacent foci of gas in the left lower quadrant.No drainable abscess identified.             - WBC has normalized. Afebrile,  Tolerating cld.   FEN - CLD VTE - SCDs, Lovenox  ID - Zosyn   LOS: 5 days    Plan:   To advance to reg  diet.  Probably home tomorrow.  Will need colonoscopy in 8 to 12 weeks.  Kandis Cocking , [facility] Surgery 04/07/2020, 9:20 AM

## 2020-04-08 LAB — CBC
HCT: 43.9 % (ref 36.0–46.0)
Hemoglobin: 14 g/dL (ref 12.0–15.0)
MCH: 30.4 pg (ref 26.0–34.0)
MCHC: 31.9 g/dL (ref 30.0–36.0)
MCV: 95.4 fL (ref 80.0–100.0)
Platelets: 294 10*3/uL (ref 150–400)
RBC: 4.6 MIL/uL (ref 3.87–5.11)
RDW: 13.6 % (ref 11.5–15.5)
WBC: 6.5 10*3/uL (ref 4.0–10.5)
nRBC: 0 % (ref 0.0–0.2)

## 2020-04-08 LAB — BASIC METABOLIC PANEL
Anion gap: 7 (ref 5–15)
BUN: <5 mg/dL — ABNORMAL LOW (ref 8–23)
CO2: 23 mmol/L (ref 22–32)
Calcium: 9.5 mg/dL (ref 8.9–10.3)
Chloride: 109 mmol/L (ref 98–111)
Creatinine, Ser: 0.89 mg/dL (ref 0.44–1.00)
GFR, Estimated: >60 mL/min (ref >60)
Glucose, Bld: 93 mg/dL (ref 70–99)
Potassium: 3.9 mmol/L (ref 3.5–5.1)
Sodium: 139 mmol/L (ref 135–145)

## 2020-04-08 MED ORDER — AMOXICILLIN-POT CLAVULANATE 875-125 MG PO TABS
1.0000 | ORAL_TABLET | Freq: Two times a day (BID) | ORAL | 0 refills | Status: DC
Start: 2020-04-08 — End: 2020-07-12

## 2020-04-08 MED ORDER — AMOXICILLIN-POT CLAVULANATE 875-125 MG PO TABS
1.0000 | ORAL_TABLET | Freq: Two times a day (BID) | ORAL | Status: DC
  Administered 2020-04-08: 1 via ORAL
  Filled 2020-04-08: qty 1

## 2020-04-08 NOTE — Progress Notes (Signed)
Reviewed all dc instructions with pt. Pt verbalized understanding of instructions. Brycen Bean, Yancey Flemings, RN

## 2020-04-08 NOTE — Discharge Summary (Signed)
Physician Discharge Summary  Patient ID: Sharon Callahan MRN: [id number] DOB/AGE: March 24, 1941 79 y.o.  Admit date: 04/02/2020 Discharge date: 04/08/2020  Admission Diagnoses: Diverticulitis with perforation  Discharge Diagnoses:  Active Problems:   Diverticulitis of colon with perforation   Discharged Condition: good  Hospital Course: Pt admitted and placed on IV abx until pain improved and she began to have bowel function.  Diet was then advanced.  No further pain noted.  She was discharged to home in stable condition with po abx to complete a 10 day course.    Consults: None  Significant Diagnostic Studies: labs: CBC, bmet and radiology: CT scan: small volume locailized free air around colon  Treatments: IV hydration, antibiotics: Zosyn and analgesia: acetaminophen  Discharge Exam: Blood pressure 131/72, pulse 67, temperature 98.2 F (36.8 C), temperature source Oral, resp. rate 18, height 5\' 5"  (1.651 m), weight 79.1 kg, SpO2 93 %. General appearance: alert and cooperative GI: normal findings: soft, non-tender  Disposition:      Follow-up Information    . Schedule an appointment as soon as possible for a visit in 2 week(s).   Specialty: Obstetrics and Gynecology Contact information: [address] [place]               Signed: [id number] 04/08/2020, 8:42 AM

## 2020-04-08 NOTE — Discharge Instructions (Signed)
Diverticulitis  Diverticulitis is infection or inflammation of small pouches (diverticula) in the colon that form due to a condition called diverticulosis. Diverticula can trap stool (feces) and bacteria, causing infection and inflammation. Diverticulitis may cause severe stomach pain and diarrhea. It may lead to tissue damage in the colon that causes bleeding. The diverticula may also burst (rupture) and cause infected stool to enter other areas of the abdomen. Complications of diverticulitis can include:  Bleeding.  Severe infection.  Severe pain.  Rupture (perforation) of the colon.  Blockage (obstruction) of the colon. What are the causes? This condition is caused by stool becoming trapped in the diverticula, which allows bacteria to grow in the diverticula. This leads to inflammation and infection. What increases the risk? You are more likely to develop this condition if:  You have diverticulosis. The risk for diverticulosis increases if: ? You are overweight or obese. ? You use tobacco products. ? You do not get enough exercise.  You eat a diet that does not include enough fiber. High-fiber foods include fruits, vegetables, beans, nuts, and whole grains. What are the signs or symptoms? Symptoms of this condition may include:  Pain and tenderness in the abdomen. The pain is normally located on the left side of the abdomen, but it may occur in other areas.  Fever and chills.  Bloating.  Cramping.  Nausea.  Vomiting.  Changes in bowel routines.  Blood in your stool. How is this diagnosed? This condition is diagnosed based on:  Your medical history.  A physical exam.  Tests to make sure there is nothing else causing your condition. These tests may include: ? Blood tests. ? Urine tests. ? Imaging tests of the abdomen, including X-rays, ultrasounds, MRIs, or CT scans. How is this treated? Most cases of this condition are mild and can be treated at home.  Treatment may include:  Taking over-the-counter pain medicines.  Following a clear liquid diet.  Taking antibiotic medicines by mouth.  Rest. More severe cases may need to be treated at a hospital. Treatment may include:  Not eating or drinking.  Taking prescription pain medicine.  Receiving antibiotic medicines through an IV tube.  Receiving fluids and nutrition through an IV tube.  Surgery. When your condition is under control, your health care provider may recommend that you have a colonoscopy. This is an exam to look at the entire large intestine. During the exam, a lubricated, bendable tube is inserted into the anus and then passed into the rectum, colon, and other parts of the large intestine. A colonoscopy can show how severe your diverticula are and whether something else may be causing your symptoms. Follow these instructions at home: Medicines  Take over-the-counter and prescription medicines only as told by your health care provider. These include fiber supplements, probiotics, and stool softeners.  If you were prescribed an antibiotic medicine, take it as told by your health care provider. Do not stop taking the antibiotic even if you start to feel better.  Do not drive or use heavy machinery while taking prescription pain medicine. General instructions   Follow a full liquid diet or another diet as directed by your health care provider. After your symptoms improve, your health care provider may tell you to change your diet. He or she may recommend that you eat a diet that contains at least 25 g (25 grams) of fiber daily. Fiber makes it easier to pass stool. Healthy sources of fiber include: ? Berries. One cup contains 4-8 grams of   fiber. ? Beans or lentils. One half cup contains 5-8 grams of fiber. ? Green vegetables. One cup contains 4 grams of fiber.  Exercise for at least 30 minutes, 3 times each week. You should exercise hard enough to raise your heart rate and  break a sweat.  Keep all follow-up visits as told by your health care provider. This is important. You may need a colonoscopy. Contact a health care provider if:  Your pain does not improve.  You have a hard time drinking or eating food.  Your bowel movements do not return to normal. Get help right away if:  Your pain gets worse.  Your symptoms do not get better with treatment.  Your symptoms suddenly get worse.  You have a fever.  You vomit more than one time.  You have stools that are bloody, black, or tarry. Summary  Diverticulitis is infection or inflammation of small pouches (diverticula) in the colon that form due to a condition called diverticulosis. Diverticula can trap stool (feces) and bacteria, causing infection and inflammation.  You are at higher risk for this condition if you have diverticulosis and you eat a diet that does not include enough fiber.  Most cases of this condition are mild and can be treated at home. More severe cases may need to be treated at a hospital.  When your condition is under control, your health care provider may recommend that you have an exam called a colonoscopy. This exam can show how severe your diverticula are and whether something else may be causing your symptoms. This information is not intended to replace advice given to you by your health care provider. Make sure you discuss any questions you have with your health care provider. Document Revised: 04/25/2017 Document Reviewed: 06/15/2016 Elsevier Patient Education  2020 [employer]  

## 2020-04-08 NOTE — Plan of Care (Signed)
  Problem: Education: Goal: Knowledge of General Education information will improve Description: Including pain rating scale, medication(s)/side effects and non-pharmacologic comfort measures Outcome: Completed/Met   Problem: Health Behavior/Discharge Planning: Goal: Ability to manage health-related needs will improve Outcome: Completed/Met   Problem: Clinical Measurements: Goal: Ability to maintain clinical measurements within normal limits will improve Outcome: Completed/Met   Problem: Activity: Goal: Risk for activity intolerance will decrease Outcome: Completed/Met   Problem: Nutrition: Goal: Adequate nutrition will be maintained Outcome: Completed/Met

## 2020-07-12 ENCOUNTER — Emergency Department (HOSPITAL_COMMUNITY): Payer: Medicare Other

## 2020-07-12 ENCOUNTER — Encounter (HOSPITAL_COMMUNITY): Payer: Self-pay

## 2020-07-12 ENCOUNTER — Other Ambulatory Visit: Payer: Self-pay

## 2020-07-12 ENCOUNTER — Emergency Department (HOSPITAL_COMMUNITY)
Admission: EM | Admit: 2020-07-12 | Discharge: 2020-07-13 | Disposition: A | Discharge status: Home / Self Care | Payer: Medicare Other | Attending: Emergency Medicine | Admitting: Emergency Medicine

## 2020-07-12 DIAGNOSIS — I10 Essential (primary) hypertension: Secondary | ICD-10-CM | POA: Insufficient documentation

## 2020-07-12 DIAGNOSIS — R1032 Left lower quadrant pain: Secondary | ICD-10-CM | POA: Diagnosis not present

## 2020-07-12 DIAGNOSIS — Z7982 Long term (current) use of aspirin: Secondary | ICD-10-CM | POA: Insufficient documentation

## 2020-07-12 DIAGNOSIS — K921 Melena: Secondary | ICD-10-CM | POA: Diagnosis not present

## 2020-07-12 DIAGNOSIS — E871 Hypo-osmolality and hyponatremia: Secondary | ICD-10-CM | POA: Insufficient documentation

## 2020-07-12 DIAGNOSIS — Z79899 Other long term (current) drug therapy: Secondary | ICD-10-CM | POA: Insufficient documentation

## 2020-07-12 DIAGNOSIS — R1084 Generalized abdominal pain: Secondary | ICD-10-CM

## 2020-07-12 LAB — CBC WITH DIFFERENTIAL/PLATELET
Abs Immature Granulocytes: 0.03 10*3/uL (ref 0.00–0.07)
Basophils Absolute: 0.1 10*3/uL (ref 0.0–0.1)
Basophils Relative: 1 %
Eosinophils Absolute: 0.1 10*3/uL (ref 0.0–0.5)
Eosinophils Relative: 1 %
HCT: 39.5 % (ref 36.0–46.0)
Hemoglobin: 13.3 g/dL (ref 12.0–15.0)
Immature Granulocytes: 0 %
Lymphocytes Relative: 17 %
Lymphs Abs: 1.6 10*3/uL (ref 0.7–4.0)
MCH: 30.6 pg (ref 26.0–34.0)
MCHC: 33.7 g/dL (ref 30.0–36.0)
MCV: 91 fL (ref 80.0–100.0)
Monocytes Absolute: 0.7 10*3/uL (ref 0.1–1.0)
Monocytes Relative: 8 %
Neutro Abs: 7 10*3/uL (ref 1.7–7.7)
Neutrophils Relative %: 73 %
Platelets: 237 10*3/uL (ref 150–400)
RBC: 4.34 MIL/uL (ref 3.87–5.11)
RDW: 12.4 % (ref 11.5–15.5)
WBC: 9.5 10*3/uL (ref 4.0–10.5)
nRBC: 0 % (ref 0.0–0.2)

## 2020-07-12 LAB — COMPREHENSIVE METABOLIC PANEL
ALT: 17 U/L (ref 0–44)
AST: 24 U/L (ref 15–41)
Albumin: 4.3 g/dL (ref 3.5–5.0)
Alkaline Phosphatase: 69 U/L (ref 38–126)
Anion gap: 11 (ref 5–15)
BUN: 9 mg/dL (ref 8–23)
CO2: 21 mmol/L — ABNORMAL LOW (ref 22–32)
Calcium: 8.9 mg/dL (ref 8.9–10.3)
Chloride: 93 mmol/L — ABNORMAL LOW (ref 98–111)
Creatinine, Ser: 0.76 mg/dL (ref 0.44–1.00)
GFR, Estimated: >60 mL/min (ref >60)
Glucose, Bld: 97 mg/dL (ref 70–99)
Potassium: 3.4 mmol/L — ABNORMAL LOW (ref 3.5–5.1)
Sodium: 125 mmol/L — ABNORMAL LOW (ref 135–145)
Total Bilirubin: 1.2 mg/dL (ref 0.3–1.2)
Total Protein: 7 g/dL (ref 6.5–8.1)

## 2020-07-12 LAB — I-STAT CHEM 8, ED
BUN: 7 mg/dL — ABNORMAL LOW (ref 8–23)
Calcium, Ion: 1.06 mmol/L — ABNORMAL LOW (ref 1.15–1.40)
Chloride: 92 mmol/L — ABNORMAL LOW (ref 98–111)
Creatinine, Ser: 0.7 mg/dL (ref 0.44–1.00)
Glucose, Bld: 95 mg/dL (ref 70–99)
HCT: 39 % (ref 36.0–46.0)
Hemoglobin: 13.3 g/dL (ref 12.0–15.0)
Potassium: 3.4 mmol/L — ABNORMAL LOW (ref 3.5–5.1)
Sodium: 126 mmol/L — ABNORMAL LOW (ref 135–145)
TCO2: 21 mmol/L — ABNORMAL LOW (ref 22–32)

## 2020-07-12 MED ORDER — SODIUM CHLORIDE 0.9 % IV BOLUS
1000.0000 mL | Freq: Once | INTRAVENOUS | Status: AC
  Administered 2020-07-12: 1000 mL via INTRAVENOUS

## 2020-07-12 MED ORDER — HYOSCYAMINE SULFATE 0.125 MG SL SUBL
0.1250 mg | SUBLINGUAL_TABLET | SUBLINGUAL | Status: AC
  Administered 2020-07-13: 0.125 mg via SUBLINGUAL
  Filled 2020-07-12: qty 1

## 2020-07-12 MED ORDER — MORPHINE SULFATE (PF) 4 MG/ML IV SOLN
4.0000 mg | Freq: Once | INTRAVENOUS | Status: AC
  Administered 2020-07-12: 4 mg via INTRAVENOUS
  Filled 2020-07-12: qty 1

## 2020-07-12 MED ORDER — HYDROMORPHONE HCL 1 MG/ML IJ SOLN: 0.5000 mg | Freq: Once | INTRAMUSCULAR | Status: DC

## 2020-07-12 MED ORDER — ONDANSETRON HCL 4 MG/2ML IJ SOLN
4.0000 mg | Freq: Once | INTRAMUSCULAR | Status: AC
  Administered 2020-07-12: 4 mg via INTRAVENOUS
  Filled 2020-07-12: qty 2

## 2020-07-12 MED ORDER — HYOSCYAMINE SULFATE 0.125 MG PO TBDP: 0.2500 mg | ORAL_TABLET | ORAL | Status: DC

## 2020-07-12 MED ORDER — IOHEXOL 300 MG/ML  SOLN
100.0000 mL | Freq: Once | INTRAMUSCULAR | Status: AC | PRN
  Administered 2020-07-12: 100 mL via INTRAVENOUS

## 2020-07-12 MED ORDER — HYDROMORPHONE HCL 1 MG/ML IJ SOLN
0.5000 mg | Freq: Once | INTRAMUSCULAR | Status: AC
  Administered 2020-07-12: 0.5 mg via INTRAVENOUS
  Filled 2020-07-12: qty 1

## 2020-07-12 NOTE — ED Provider Notes (Signed)
[facility]-EMERGENCY DEPT Provider Note   CSN: [id number] Arrival date & time: 07/12/20  2030     History Chief Complaint  Patient presents with   Abdominal Pain    Sharon Callahan is a 80 y.o. female.  80 yo F with a chief complaints of left lower quadrant pain and bloody stool.  This been going on for a few hours now.  The patient is doing a prep for a colonoscopy tomorrow and developed the symptoms.  Having some nausea and vomiting as well.  No fevers that she is noted.  Feels similar to when she had a perforated diverticulum.  The history is provided by the patient.  Abdominal Pain Pain location:  LLQ Pain quality: stabbing   Pain quality: not sharp   Pain radiates to:  Back Pain severity:  Moderate Onset quality:  Gradual Duration:  3 hours Timing:  Constant Progression:  Worsening Chronicity:  New Relieved by:  Nothing Worsened by:  Nothing Ineffective treatments:  None tried Associated symptoms: diarrhea, nausea and vomiting   Associated symptoms: no chest pain, no chills, no dysuria, no fever and no shortness of breath        Past Medical History:  Diagnosis Date   Hypertension     Patient Active Problem List   Diagnosis Date Noted   Diverticulitis of colon with perforation 04/02/2020    Past Surgical History:  Procedure Laterality Date   ABDOMINAL HYSTERECTOMY     CHOLECYSTECTOMY       OB History   No obstetric history on file.     No family history on file.  Social History   Tobacco Use   Smoking status: Never Smoker   Smokeless tobacco: Never Used  Substance Use Topics   Alcohol use: Never   Drug use: Never    Home Medications Prior to Admission medications   Medication Sig Start Date End Date Taking? Authorizing Provider  amLODipine (NORVASC) 5 MG tablet Take 5 mg by mouth daily. 03/07/20   [provider]  amoxicillin-clavulanate (AUGMENTIN) 875-125 MG tablet Take 1 tablet by mouth every  12 (twelve) hours. 04/08/20   Romie Levee, MD  aspirin EC 81 MG tablet Take 81 mg by mouth daily. Swallow whole.    [provider]  Aspirin-Acetaminophen-Caffeine (GOODY HEADACHE PO) Take 1 packet by mouth as needed (headache/pain).    [provider]  estradiol (ESTRACE) 0.5 MG tablet Take 0.5 mg by mouth daily. 03/06/20   [provider]  sodium chloride (OCEAN) 0.65 % SOLN nasal spray Place 1 spray into both nostrils as needed for congestion.    [provider]  spironolactone (ALDACTONE) 25 MG tablet Take 25 mg by mouth daily. 03/12/20   [provider]  VITAMIN D PO Take 1 capsule by mouth daily.    [provider]    Allergies    Ciprofloxacin  Review of Systems   Review of Systems  Constitutional: Negative for chills and fever.  HENT: Negative for congestion and rhinorrhea.   Eyes: Negative for redness and visual disturbance.  Respiratory: Negative for shortness of breath and wheezing.   Cardiovascular: Negative for chest pain and palpitations.  Gastrointestinal: Positive for abdominal pain, diarrhea, nausea and vomiting.  Genitourinary: Negative for dysuria and urgency.  Musculoskeletal: Negative for arthralgias and myalgias.  Skin: Negative for pallor and wound.  Neurological: Negative for dizziness and headaches.    Physical Exam Updated Vital Signs There were no vitals taken for this visit.  Physical Exam Vitals and nursing note reviewed.  Constitutional:      General: She is not in acute distress.    Appearance: She is well-developed and well-nourished. She is not diaphoretic.  HENT:     Head: Normocephalic and atraumatic.  Eyes:     Extraocular Movements: EOM normal.     Pupils: Pupils are equal, round, and reactive to light.  Cardiovascular:     Rate and Rhythm: Normal rate and regular rhythm.     Heart sounds: No murmur heard. No friction rub. No gallop.   Pulmonary:     Effort: Pulmonary effort is  normal.     Breath sounds: No wheezing or rales.  Abdominal:     General: There is no distension.     Palpations: Abdomen is soft.     Tenderness: There is abdominal tenderness (worst to the LLQ).  Musculoskeletal:        General: No tenderness or edema.     Cervical back: Normal range of motion and neck supple.  Skin:    General: Skin is warm and dry.  Neurological:     Mental Status: She is alert and oriented to person, place, and time.  Psychiatric:        Mood and Affect: Mood and affect normal.        Behavior: Behavior normal.     ED Results / Procedures / Treatments   Labs (all labs ordered are listed, but only abnormal results are displayed) Labs Reviewed  CBC WITH DIFFERENTIAL/PLATELET  COMPREHENSIVE METABOLIC PANEL  I-STAT CHEM 8, ED    EKG None  Radiology No results found.  Procedures Procedures   Medications Ordered in ED Medications  morphine 4 MG/ML injection 4 mg (has no administration in time range)  ondansetron (ZOFRAN) injection 4 mg (has no administration in time range)  sodium chloride 0.9 % bolus 1,000 mL (has no administration in time range)    ED Course  I have reviewed the triage vital signs and the nursing notes.  Pertinent labs & imaging results that were available during my care of the patient were reviewed by me and considered in my medical decision making (see chart for details).    MDM Rules/Calculators/A&P                          80 yo F with a chief complaints of left lower quadrant abdominal pain and bloody diarrhea going on for a few hours now.  Patient with focal left lower quadrant tenderness.  Will obtain blood work a CT scan.  Blood work with hyponatremia.  Patient feeling better after symptomatic therapy.  CT scan without obvious pathology as viewed by me.  Awaiting formal radiology read.  Signed out to Dr. Blinda Leatherwood please see his note for further details care in the ED.  The patients results and plan were reviewed and  discussed.   Any x-rays performed were independently reviewed by myself.   Differential diagnosis were considered with the presenting HPI.  Medications  morphine 4 MG/ML injection 4 mg (4 mg Intravenous Given 07/12/20 2114)  ondansetron [facility]) injection 4 mg (4 mg Intravenous Given 07/12/20 2112)  sodium chloride 0.9 % bolus 1,000 mL (0 mLs Intravenous Stopped 07/12/20 2211)  sodium chloride 0.9 % bolus 1,000 mL (0 mLs Intravenous Stopped 07/12/20 2312)  iohexol (OMNIPAQUE) 300 MG/ML solution 100 mL (100 mLs Intravenous Contrast Given 07/12/20 2231)  ondansetron (ZOFRAN) injection 4 mg (4 mg Intravenous  Given 07/12/20 2312)  HYDROmorphone (DILAUDID) injection 0.5 mg (0.5 mg Intravenous Given 07/12/20 2314)  hyoscyamine (LEVSIN SL) SL tablet 0.125 mg (0.125 mg Sublingual Given 07/13/20 0015)    Vitals:   07/13/20 0130 07/13/20 0135 07/13/20 0200 07/13/20 0222  BP: 121/65  123/62   Pulse: 67  66   Resp: (!) 21  16   Temp:    97.7 F (36.5 C)  TempSrc:    Oral  SpO2: 91% 97% 99%     Final diagnoses:  Generalized abdominal pain    Admission/ observation were discussed with the admitting physician, patient and/or family and they are comfortable with the plan.   Final Clinical Impression(s) / ED Diagnoses Final diagnoses:  None    Rx / DC Orders ED Discharge Orders    None       Melene Plan, DO 07/13/20 1525

## 2020-07-12 NOTE — ED Triage Notes (Signed)
Patient arrives with complaints of LLQ pain and vomiting with blood noted. States she has a history of a bowel perforation and was completing a bowel prep for a colonoscopy tomorrow.

## 2020-07-13 LAB — URINALYSIS, ROUTINE W REFLEX MICROSCOPIC
Bacteria, UA: NONE SEEN
Bilirubin Urine: NEGATIVE
Glucose, UA: NEGATIVE mg/dL
Hgb urine dipstick: NEGATIVE
Ketones, ur: NEGATIVE mg/dL
Nitrite: NEGATIVE
Protein, ur: NEGATIVE mg/dL
Specific Gravity, Urine: 1.009 (ref 1.005–1.030)
pH: 7 (ref 5.0–8.0)

## 2020-07-13 NOTE — ED Provider Notes (Signed)
Patient signed out to me by Dr. Adela Lank to follow-up on CT scan.  Patient developed nausea, vomiting and lower abdominal pain while performing bowel prep for colonoscopy tomorrow.  CT has been performed.  No acute pathology noted other than a distended bladder.  Since the scan, however, patient has put out a couple of liters of urine.  It appears that the patient has taken in too much free water during her bowel prep causing mild hyponatremia and significant urinary bladder distention.  Patient has been given analgesia and now is feeling much better.  Bladder appears to be empty and nontender.  Will discharge to home.   Gilda Crease, MD 07/13/20 [id number]

## 2020-07-13 NOTE — Discharge Instructions (Addendum)
Let Dr. Dulce Sellar know this morning what happened tonight and that he will not be having your colonoscopy.

## 2021-05-09 ENCOUNTER — Ambulatory Visit
Admission: RE | Admit: 2021-05-09 | Discharge: 2021-05-09 | Disposition: A | Discharge status: Home / Self Care | Payer: Medicare Other | Source: Ambulatory Visit | Attending: Sports Medicine | Admitting: Sports Medicine

## 2021-05-09 ENCOUNTER — Other Ambulatory Visit: Payer: Self-pay | Admitting: Sports Medicine

## 2021-05-09 DIAGNOSIS — M25552 Pain in left hip: Secondary | ICD-10-CM

## 2021-05-09 DIAGNOSIS — M25531 Pain in right wrist: Secondary | ICD-10-CM

## 2021-06-06 ENCOUNTER — Other Ambulatory Visit: Payer: Self-pay | Admitting: Sports Medicine

## 2021-06-06 DIAGNOSIS — M25552 Pain in left hip: Secondary | ICD-10-CM

## 2021-06-16 ENCOUNTER — Other Ambulatory Visit: Payer: Self-pay

## 2021-06-16 ENCOUNTER — Ambulatory Visit
Admission: RE | Admit: 2021-06-16 | Discharge: 2021-06-16 | Disposition: A | Discharge status: Home / Self Care | Payer: Medicare Other | Source: Ambulatory Visit | Attending: Sports Medicine | Admitting: Sports Medicine

## 2021-06-16 DIAGNOSIS — M25552 Pain in left hip: Secondary | ICD-10-CM

## 2021-06-19 ENCOUNTER — Other Ambulatory Visit: Payer: Medicare Other

## 2023-04-27 IMAGING — MR MR HIP*L* W/O CM
5 series · 36 of 40 positions shown · non-contrast
Comparison: Hip radiographs 05/09/2021

CLINICAL DATA: Left hip pain. Prior fall.

EXAM:
MR OF THE LEFT HIP WITHOUT CONTRAST
TECHNIQUE: Multiplanar, multisequence MR imaging was performed. No intravenous
contrast was administered.

[Series 9: T2 fat-sat · coronal · left · 3.0mm · 0.85mm/px · 8 of 30 slices shown (1 of 2)]
[im 1/30]
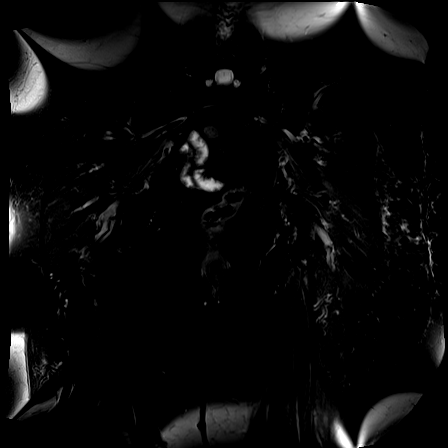
[im 5/30]
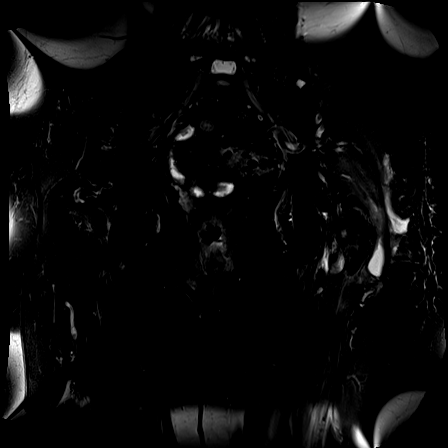
[im 9/30]
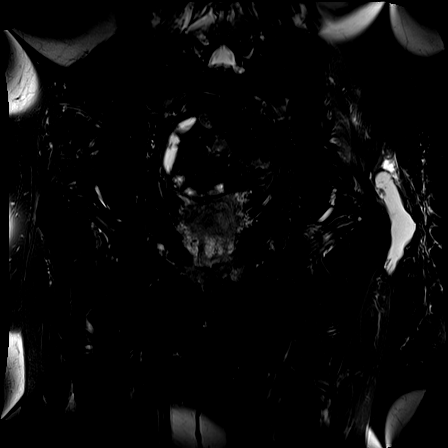
[im 13/30]
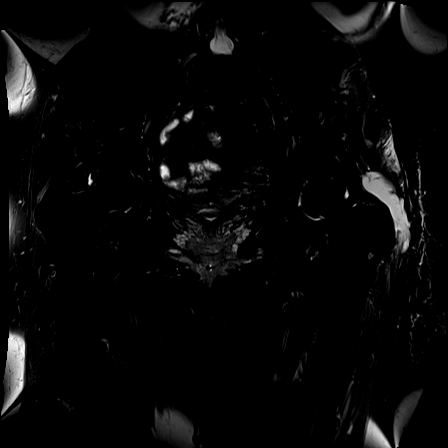
[im 17/30]
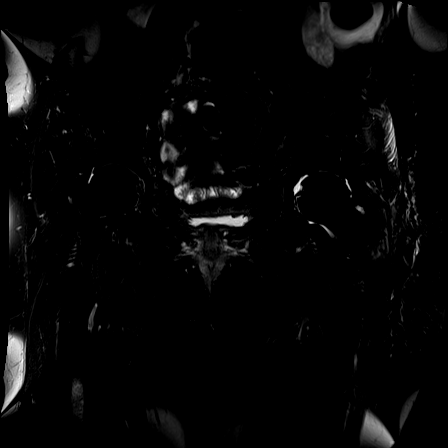
[im 21/30]
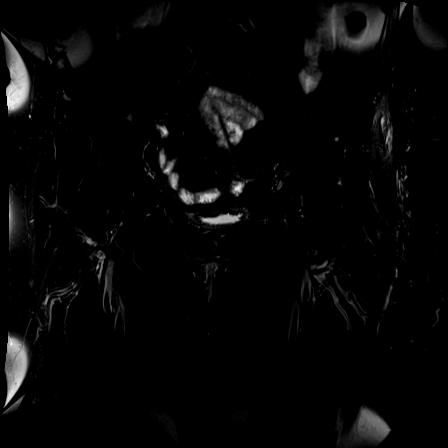
[im 25/30]
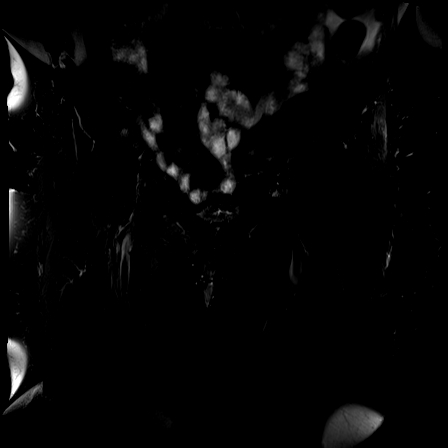
[im 30/30]
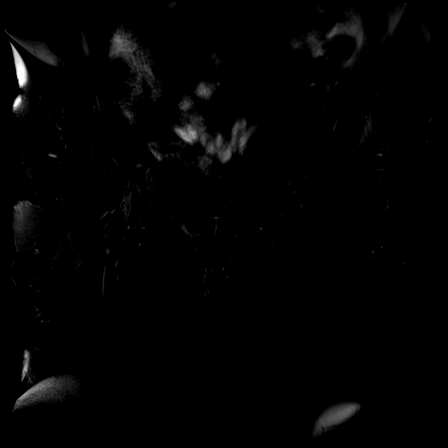

[Series 10: T1 · coronal · left · 3.0mm · 0.85mm/px · 4 of 30 slices shown]
[im 1/30]
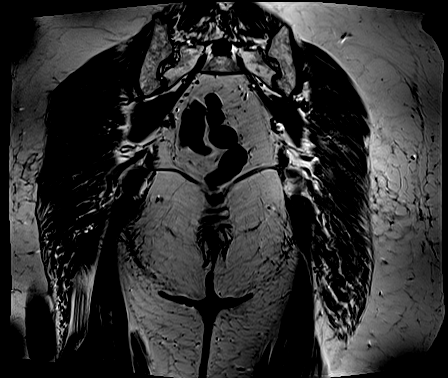
[im 5/30]
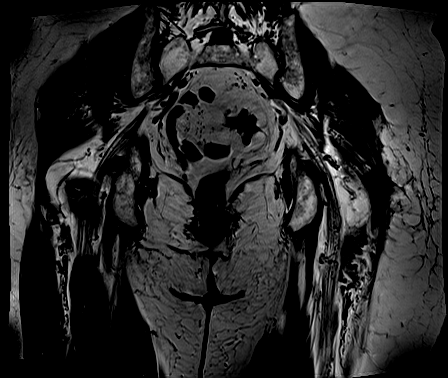
[im 9/30]
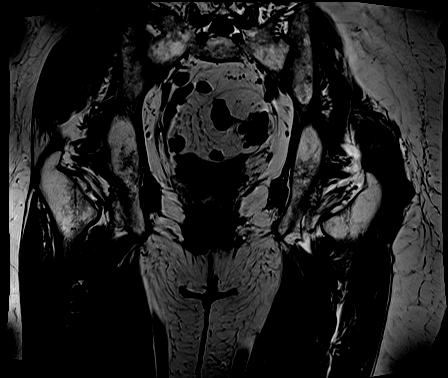
[im 13/30]
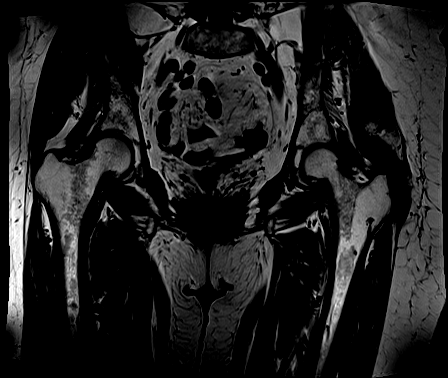

[Series 11: T2 fat-sat · axial · left · 3.0mm · 1.22mm/px · z∈[-86,+25]mm · 8 of 32 slices shown (2 of 2)]
[im 1/32]
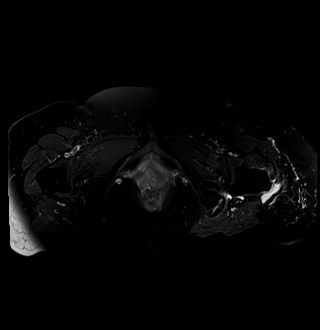
[im 5/32]
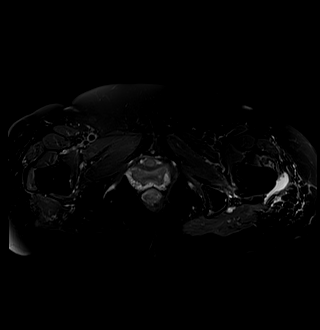
[im 9/32]
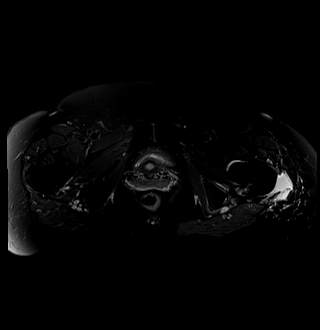
[im 14/32]
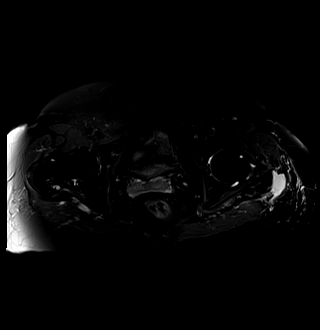
[im 18/32]
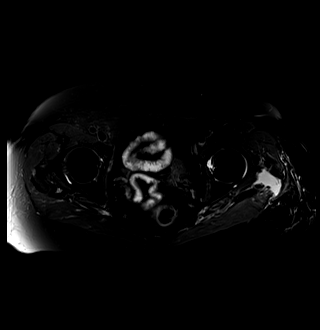
[im 23/32]
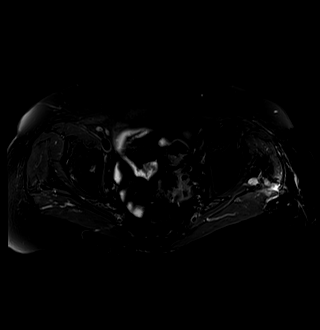
[im 27/32]
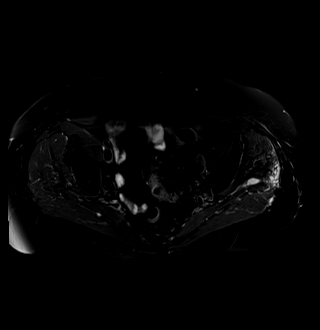
[im 32/32]
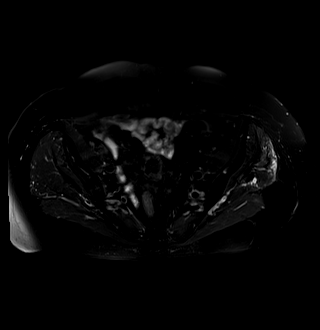

[Series 12: PD fat-sat · coronal · left · 3.0mm · 0.56mm/px · 7 of 28 slices shown (1 of 2)]
[im 1/28]
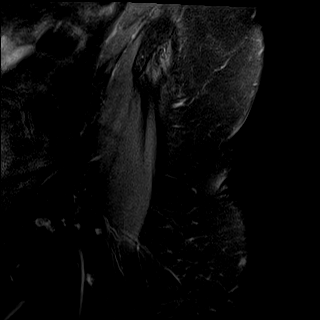
[im 5/28]
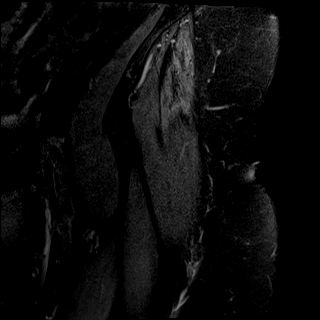
[im 10/28]
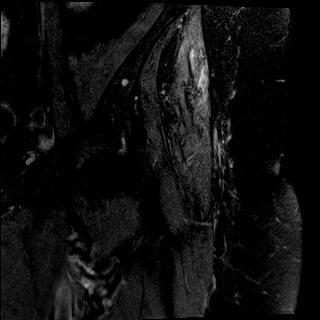
[im 14/28]
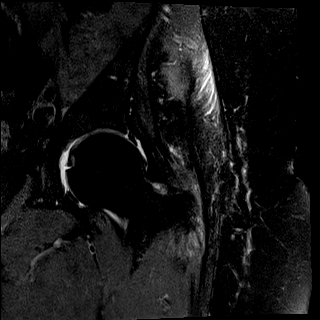
[im 19/28]
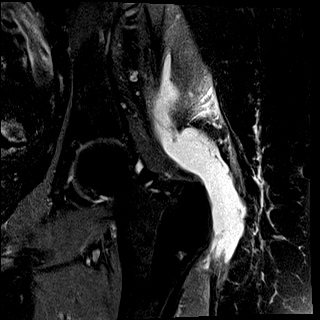
[im 23/28]
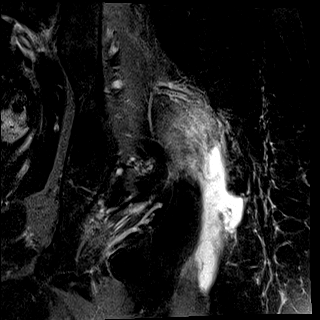
[im 28/28]
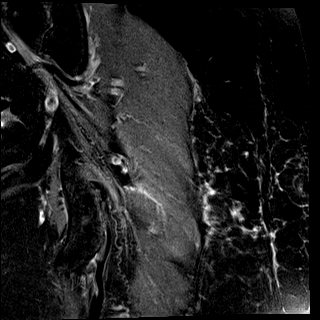

[Series 13: PD fat-sat · sagittal · left · 3.0mm · 0.56mm/px · 9 of 35 slices shown (2 of 2)]
[im 1/35]
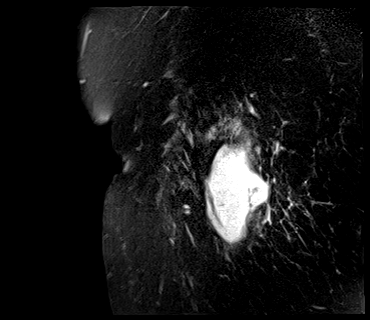
[im 5/35]
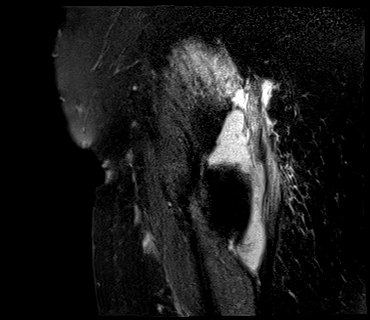
[im 9/35]
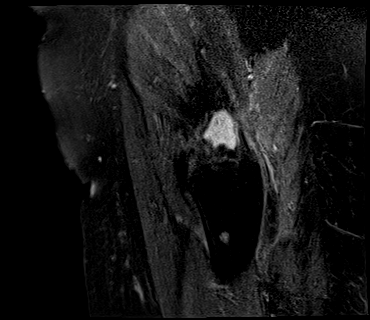
[im 13/35]
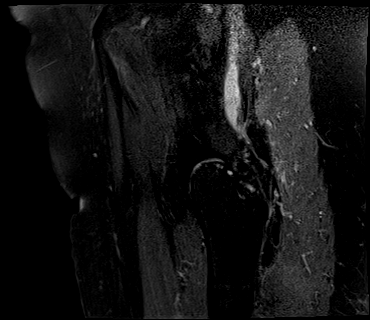
[im 18/35]
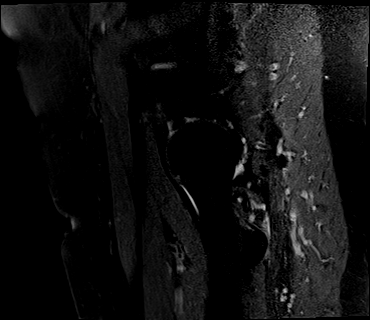
[im 22/35]
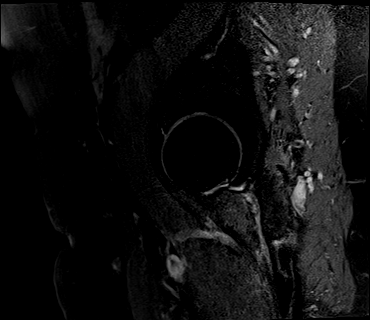
[im 26/35]
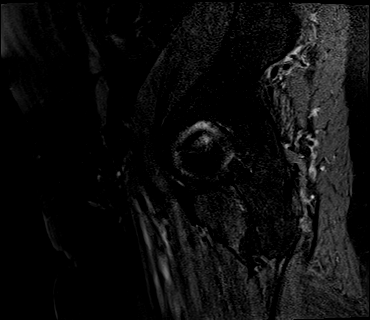
[im 30/35]
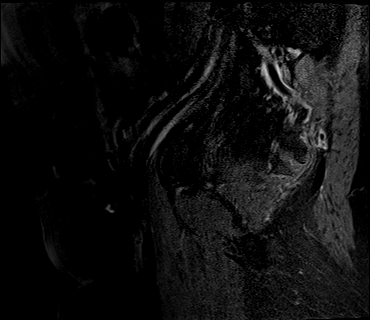
[im 35/35]
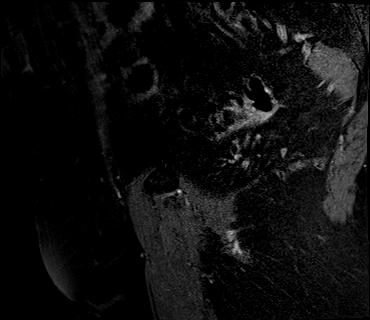

[36 of 40 positions shown; findings below may reference images not displayed]

FINDINGS: Both hips are normally located. Mild age related degenerative
changes. No stress fracture or AVN. No hip joint effusion. There is
a labral tear involving the superior anterior labrum of the left
hip.

Chronic ruptured gluteal tendons from their attachment site on the
greater trochanter with fatty atrophy of the muscles, most notably
the gluteus medius and minimus muscles. Associated large fluid
collection surrounding the greater trochanter which could be a
liquified hematoma or severe bursitis.

Mild right-sided peritendinitis but no trochanteric bursitis. The
hamstring tendons are intact. Mild tendinopathy bilaterally.

The pubic symphysis and SI joints are intact. No pelvic fractures or
bone lesions.

No significant intrapelvic abnormalities are identified.
Diverticulosis of the sigmoid colon is noted.
IMPRESSION: 1. Chronic ruptured gluteal tendons from their attachment site on
the greater trochanter with fatty atrophy of the muscles, most
notably the gluteus medius and minimus muscles.
2. Associated large fluid collection surrounding the greater
trochanter which could be a liquified hematoma or severe bursitis.
3. No stress fracture or AVN.
4. Labral tear involving the superior anterior labrum of the left
hip.

## 2024-04-29 ENCOUNTER — Other Ambulatory Visit: Payer: Self-pay | Admitting: Internal Medicine

## 2024-04-29 DIAGNOSIS — E041 Nontoxic single thyroid nodule: Secondary | ICD-10-CM

## 2024-05-03 ENCOUNTER — Ambulatory Visit
Admission: RE | Admit: 2024-05-03 | Discharge: 2024-05-03 | Disposition: A | Source: Ambulatory Visit | Attending: Internal Medicine | Admitting: Internal Medicine

## 2024-05-03 DIAGNOSIS — E041 Nontoxic single thyroid nodule: Secondary | ICD-10-CM

## 2024-05-07 ENCOUNTER — Other Ambulatory Visit: Payer: Self-pay | Admitting: Internal Medicine

## 2024-05-07 DIAGNOSIS — E041 Nontoxic single thyroid nodule: Secondary | ICD-10-CM

## 2024-05-10 ENCOUNTER — Other Ambulatory Visit

## 2024-06-02 ENCOUNTER — Ambulatory Visit
Admission: RE | Admit: 2024-06-02 | Discharge: 2024-06-02 | Disposition: A | Discharge status: Home / Self Care | Source: Ambulatory Visit | Attending: Internal Medicine | Admitting: Internal Medicine

## 2024-06-02 ENCOUNTER — Other Ambulatory Visit (HOSPITAL_COMMUNITY)
Admission: RE | Admit: 2024-06-02 | Discharge: 2024-06-02 | Disposition: A | Discharge status: Home / Self Care | Source: Ambulatory Visit | Attending: Internal Medicine | Admitting: Internal Medicine

## 2024-06-02 DIAGNOSIS — E041 Nontoxic single thyroid nodule: Secondary | ICD-10-CM | POA: Diagnosis present

## 2024-06-04 LAB — CYTOLOGY - NON PAP

## 2024-06-10 ENCOUNTER — Other Ambulatory Visit: Payer: Self-pay | Admitting: General Surgery

## 2024-06-10 DIAGNOSIS — D34 Benign neoplasm of thyroid gland: Secondary | ICD-10-CM

## 2024-06-16 ENCOUNTER — Inpatient Hospital Stay
Admission: RE | Admit: 2024-06-16 | Discharge: 2024-06-16 | Disposition: A | Source: Ambulatory Visit | Attending: General Surgery

## 2024-06-16 DIAGNOSIS — D34 Benign neoplasm of thyroid gland: Secondary | ICD-10-CM

## 2024-06-16 MED ORDER — IOHEXOL 300 MG/ML  SOLN
75.0000 mL | Freq: Once | INTRAMUSCULAR | Status: AC | PRN
  Administered 2024-06-16: 75 mL via INTRAVENOUS

## 2024-06-16 NOTE — Progress Notes (Signed)
 This RN and Warren, NP called to CT room for possible reaction, at arrival pt sitting up on edge of CT table, Tammy, CT Tech at side, alert and oriented, no signs of distress, pulse ox at 99% on RA. Informed that pt had brief c/o of throat tightness.  NO meds given at this time.  Per Warren, NP, pt to stay for 15 minutes to monitor.  1210  Pt remains stable, alert, oriented, and talking.  States she feels ok when this RN followed up with her.  Husband at side.  Tammy present. Safety maintained.

## 2024-06-18 ENCOUNTER — Ambulatory Visit: Payer: Self-pay | Admitting: General Surgery

## 2024-06-18 ENCOUNTER — Encounter (HOSPITAL_COMMUNITY): Payer: Self-pay

## 2024-06-21 NOTE — Patient Instructions (Addendum)
 SURGICAL WAITING ROOM VISITATION Patients having surgery or a procedure may have no more than 2 support people in the waiting area - these visitors may rotate.    Children under the age of 59 will not be allowed to visit due to the increase in respiratory illness  Children under the age of 34 must have an adult with them who is not the patient.  If the patient needs to stay at the hospital during part of their recovery, the visitor guidelines for inpatient rooms apply. Pre-op nurse will coordinate an appropriate time for 1 support person to accompany patient in pre-op.  This support person may not rotate.    Please refer to the [facility] website for the visitor guidelines for Inpatients (after your surgery is over and you are in a regular room).       Your procedure is scheduled on: 06-28-24   Report to [facility] Main Entrance    Report to admitting at 7:15 AM   Call this number if you have problems the morning of surgery [phone]   Do not eat food :After Midnight.   After Midnight you may have the following liquids until 6:30 AM DAY OF SURGERY  Water Non-Citrus Juices (without pulp, NO RED-Apple, White grape, White cranberry) Black Coffee (NO MILK/CREAM OR CREAMERS, sugar ok)  Clear Tea (NO MILK/CREAM OR CREAMERS, sugar ok) regular and decaf                             Plain Jell-O (NO RED)                                           Fruit ices (not with fruit pulp, NO RED)                                     Popsicles (NO RED)                                                               Sports drinks like Gatorade (NO RED)                       If you have questions, please contact your surgeons office.   FOLLOW  ANY ADDITIONAL PRE OP INSTRUCTIONS YOU RECEIVED FROM YOUR SURGEON'S OFFICE!!!     Oral Hygiene is also important to reduce your risk of infection.                                    Remember - BRUSH YOUR TEETH THE MORNING OF SURGERY WITH YOUR  REGULAR TOOTHPASTE   Do NOT smoke after Midnight   Take these medicines the morning of surgery with A SIP OF WATER:    Amlodipine  (Norvasc )   Atorvastatin (Lipitor)   Bactrim  Stop all vitamins and herbal supplements 7 days before surgery  Bring CPAP mask and tubing day of surgery.  You may not have any metal on your body including hair pins, jewelry, and body piercing             Do not wear make-up, lotions, powders, perfumes or deodorant  Do not wear nail polish including gel and S&S, artificial/acrylic nails, or any other type of covering on natural nails including finger and toenails. If you have artificial nails, gel coating, etc. that needs to be removed by a nail salon please have this removed prior to surgery or surgery may need to be canceled/ delayed if the surgeon/ anesthesia feels like they are unable to be safely monitored.   Do not shave  48 hours prior to surgery.        Do not bring valuables to the hospital. [facility] IS NOT RESPONSIBLE   FOR VALUABLES.   Contacts, dentures or bridgework may not be worn into surgery.   Bring small overnight bag day of surgery.   DO NOT BRING YOUR HOME MEDICATIONS TO THE HOSPITAL. PHARMACY WILL DISPENSE MEDICATIONS LISTED ON YOUR MEDICATION LIST TO YOU DURING YOUR ADMISSION IN THE HOSPITAL!   Special Instructions: Bring a copy of your healthcare power of attorney and living will documents the day of surgery if you haven't scanned them before.              Please read over the following fact sheets you were given: IF YOU HAVE QUESTIONS ABOUT YOUR PRE-OP INSTRUCTIONS PLEASE CALL [phone] [place] or [phone]  If you received a COVID test during your pre-op visit  it is requested that you wear a mask when out in public, stay away from anyone that may not be feeling well and notify your surgeon if you develop symptoms. If you test positive for Covid or have been in contact with anyone that has tested  positive in the last 10 days please notify you surgeon.  [facility] - Preparing for Surgery Before surgery, you can play an important role.  Because skin is not sterile, your skin needs to be as free of germs as possible.  You can reduce the number of germs on your skin by washing with CHG (chlorahexidine gluconate) soap before surgery.  CHG is an antiseptic cleaner which kills germs and bonds with the skin to continue killing germs even after washing. Please DO NOT use if you have an allergy to CHG or antibacterial soaps.  If your skin becomes reddened/irritated stop using the CHG and inform your nurse when you arrive at Short Stay. Do not shave (including legs and underarms) for at least 48 hours prior to the first CHG shower.  You may shave your face/neck.  Please follow these instructions carefully:  1.  Shower with CHG Soap the night before surgery ONLY (DO NOT USE THE SOAP THE MORNING OF SURGERY).  2.  If you choose to wash your hair, wash your hair first as usual with your normal  shampoo.  3.  After you shampoo, rinse your hair and body thoroughly to remove the shampoo.                             4.  Use CHG as you would any other liquid soap.  You can apply chg directly to the skin and wash.  Gently with a scrungie or clean washcloth.  5.  Apply the CHG Soap to your body ONLY FROM THE NECK DOWN.   Do   not use on face/  open                           Wound or open sores. Avoid contact with eyes, ears mouth and   genitals (private parts).                       Wash face,  Genitals (private parts) with your normal soap.             6.  Wash thoroughly, paying special attention to the area where your    surgery  will be performed.  7.  Thoroughly rinse your body with warm water from the neck down.  8.  DO NOT shower/wash with your normal soap after using and rinsing off the CHG Soap.                9.  Pat yourself dry with a clean towel.            10.  Wear clean pajamas.            11.   Place clean sheets on your bed the night of your first shower and do not  sleep with pets. Day of Surgery :  Shower with regular soap Do not apply any CHG, lotions/deodorants the morning of surgery.  Please wear clean clothes to the hospital/surgery center.  FAILURE TO FOLLOW THESE INSTRUCTIONS MAY RESULT IN THE CANCELLATION OF YOUR SURGERY  PATIENT SIGNATURE_________________________________  NURSE SIGNATURE__________________________________  ________________________________________________________________________

## 2024-06-22 NOTE — Progress Notes (Signed)
 Date of COVID positive in last 90 days:  PCP - [place]  Cleotilde, PA-C Cardiologist -   Chest x-ray - N/A EKG - N/A Stress Test - N/A ECHO - N/A Cardiac Cath - N/A Pacemaker/ICD device last checked:N/A Spinal Cord Stimulator:N/A  Bowel Prep - N/A  Sleep Study - N/A CPAP -   Fasting Blood Sugar - N/A Checks Blood Sugar _____ times a day  Last dose of GLP1 agonist-  N/A GLP1 instructions:  Do not take after     Last dose of SGLT-2 inhibitors-  N/A SGLT-2 instructions:  Do not take after     Blood Thinner Instructions: N/A Last dose:   Time: Aspirin Instructions:N/A Last Dose:  Activity level:  Can go up a flight of stairs and perform activities of daily living without stopping and without symptoms of chest pain or shortness of breath.  Able to exercise without symptoms  Unable to go up a flight of stairs without symptoms of     Anesthesia review: N/A  Patient denies shortness of breath, fever, cough and chest pain at PAT appointment  Patient verbalized understanding of instructions that were given to them at the PAT appointment. Patient was also instructed that they will need to review over the PAT instructions again at home before surgery.

## 2024-06-24 ENCOUNTER — Encounter (HOSPITAL_COMMUNITY)
Admission: RE | Admit: 2024-06-24 | Discharge: 2024-06-24 | Disposition: A | Discharge status: Home / Self Care | Source: Ambulatory Visit | Attending: General Surgery | Admitting: General Surgery

## 2024-06-24 ENCOUNTER — Encounter (HOSPITAL_COMMUNITY): Payer: Self-pay

## 2024-06-24 ENCOUNTER — Other Ambulatory Visit: Payer: Self-pay

## 2024-06-24 VITALS — BP 139/72 | HR 66 | Temp 98.4°F | Resp 12 | Ht 65.0 in | Wt 161.0 lb

## 2024-06-24 DIAGNOSIS — I10 Essential (primary) hypertension: Secondary | ICD-10-CM | POA: Insufficient documentation

## 2024-06-24 DIAGNOSIS — Z01818 Encounter for other preprocedural examination: Secondary | ICD-10-CM | POA: Diagnosis present

## 2024-06-24 HISTORY — DX: Unspecified malignant neoplasm of skin, unspecified: C44.90

## 2024-06-24 LAB — BASIC METABOLIC PANEL WITH GFR
Anion gap: 9 (ref 5–15)
BUN: 10 mg/dL (ref 8–23)
CO2: 25 mmol/L (ref 22–32)
Calcium: 10.6 mg/dL — ABNORMAL HIGH (ref 8.9–10.3)
Chloride: 104 mmol/L (ref 98–111)
Creatinine, Ser: 0.97 mg/dL (ref 0.44–1.00)
GFR, Estimated: 58 mL/min — ABNORMAL LOW
Glucose, Bld: 92 mg/dL (ref 70–99)
Potassium: 4.3 mmol/L (ref 3.5–5.1)
Sodium: 138 mmol/L (ref 135–145)

## 2024-06-24 LAB — CBC
HCT: 46.7 % — ABNORMAL HIGH (ref 36.0–46.0)
Hemoglobin: 14.7 g/dL (ref 12.0–15.0)
MCH: 30.2 pg (ref 26.0–34.0)
MCHC: 31.5 g/dL (ref 30.0–36.0)
MCV: 96.1 fL (ref 80.0–100.0)
Platelets: 255 10*3/uL (ref 150–400)
RBC: 4.86 MIL/uL (ref 3.87–5.11)
RDW: 12.6 % (ref 11.5–15.5)
WBC: 5.6 10*3/uL (ref 4.0–10.5)
nRBC: 0 % (ref 0.0–0.2)

## 2024-06-28 ENCOUNTER — Encounter (HOSPITAL_COMMUNITY): Payer: Self-pay | Admitting: General Surgery

## 2024-06-28 ENCOUNTER — Encounter (HOSPITAL_COMMUNITY): Admission: RE | Payer: Self-pay | Source: Home / Self Care

## 2024-06-28 ENCOUNTER — Observation Stay (HOSPITAL_COMMUNITY)
Admission: RE | Admit: 2024-06-28 | Discharge: 2024-06-29 | Disposition: A | Source: Home / Self Care | Attending: General Surgery | Admitting: General Surgery

## 2024-06-28 ENCOUNTER — Ambulatory Visit (HOSPITAL_COMMUNITY): Admitting: Registered Nurse

## 2024-06-28 ENCOUNTER — Other Ambulatory Visit: Payer: Self-pay

## 2024-06-28 DIAGNOSIS — D497 Neoplasm of unspecified behavior of endocrine glands and other parts of nervous system: Secondary | ICD-10-CM | POA: Diagnosis not present

## 2024-06-28 DIAGNOSIS — E079 Disorder of thyroid, unspecified: Principal | ICD-10-CM | POA: Diagnosis present

## 2024-06-28 LAB — CBC
HCT: 42.2 % (ref 36.0–46.0)
Hemoglobin: 13.7 g/dL (ref 12.0–15.0)
MCH: 30.2 pg (ref 26.0–34.0)
MCHC: 32.5 g/dL (ref 30.0–36.0)
MCV: 93 fL (ref 80.0–100.0)
Platelets: 248 10*3/uL (ref 150–400)
RBC: 4.54 MIL/uL (ref 3.87–5.11)
RDW: 12.6 % (ref 11.5–15.5)
WBC: 13.1 10*3/uL — ABNORMAL HIGH (ref 4.0–10.5)
nRBC: 0 % (ref 0.0–0.2)

## 2024-06-28 LAB — CREATININE, SERUM
Creatinine, Ser: 0.79 mg/dL (ref 0.44–1.00)
GFR, Estimated: >60 mL/min

## 2024-06-28 MED ORDER — ALBUTEROL SULFATE (2.5 MG/3ML) 0.083% IN NEBU: 3.0000 mL | INHALATION_SOLUTION | Freq: Four times a day (QID) | RESPIRATORY_TRACT | Status: DC | PRN

## 2024-06-28 MED ORDER — ORAL CARE MOUTH RINSE: 15.0000 mL | Freq: Once | OROMUCOSAL | Status: AC

## 2024-06-28 MED ORDER — FENTANYL CITRATE (PF) 100 MCG/2ML IJ SOLN
INTRAMUSCULAR | Status: AC
  Filled 2024-06-28: qty 2

## 2024-06-28 MED ORDER — PROPOFOL 10 MG/ML IV BOLUS
INTRAVENOUS | Status: AC
  Filled 2024-06-28: qty 20

## 2024-06-28 MED ORDER — MIDAZOLAM HCL 2 MG/2ML IJ SOLN
INTRAMUSCULAR | Status: AC
  Filled 2024-06-28: qty 2

## 2024-06-28 MED ORDER — OXYCODONE HCL 5 MG/5ML PO SOLN: 5.0000 mg | Freq: Once | ORAL | Status: DC | PRN

## 2024-06-28 MED ORDER — ONDANSETRON HCL 4 MG/2ML IJ SOLN: 4.0000 mg | Freq: Four times a day (QID) | INTRAMUSCULAR | Status: DC | PRN

## 2024-06-28 MED ORDER — FENTANYL CITRATE (PF) 100 MCG/2ML IJ SOLN
INTRAMUSCULAR | Status: DC | PRN
  Administered 2024-06-28 (×3): 25 ug via INTRAVENOUS
  Administered 2024-06-28: 50 ug via INTRAVENOUS

## 2024-06-28 MED ORDER — MIDAZOLAM HCL (PF) 2 MG/2ML IJ SOLN
0.5000 mg | Freq: Once | INTRAMUSCULAR | Status: AC
  Administered 2024-06-28: 0.5 mg via INTRAVENOUS

## 2024-06-28 MED ORDER — CHLORHEXIDINE GLUCONATE 0.12 % MT SOLN
15.0000 mL | Freq: Once | OROMUCOSAL | Status: AC
  Administered 2024-06-28: 15 mL via OROMUCOSAL

## 2024-06-28 MED ORDER — FENTANYL CITRATE (PF) 50 MCG/ML IJ SOSY
25.0000 ug | PREFILLED_SYRINGE | INTRAMUSCULAR | Status: DC | PRN
  Administered 2024-06-28 (×2): 50 ug via INTRAVENOUS

## 2024-06-28 MED ORDER — ONDANSETRON HCL 4 MG PO TABS: 4.0000 mg | ORAL_TABLET | Freq: Four times a day (QID) | ORAL | Status: DC | PRN

## 2024-06-28 MED ORDER — ACETAMINOPHEN 500 MG PO TABS
ORAL_TABLET | ORAL | Status: AC
  Filled 2024-06-28: qty 2

## 2024-06-28 MED ORDER — CHLORHEXIDINE GLUCONATE CLOTH 2 % EX PADS: 6.0000 | MEDICATED_PAD | Freq: Once | CUTANEOUS | Status: DC

## 2024-06-28 MED ORDER — DIPHENHYDRAMINE HCL 50 MG/ML IJ SOLN: 12.5000 mg | Freq: Four times a day (QID) | INTRAMUSCULAR | Status: DC | PRN

## 2024-06-28 MED ORDER — DEXAMETHASONE SOD PHOSPHATE PF 10 MG/ML IJ SOLN
INTRAMUSCULAR | Status: AC
  Filled 2024-06-28: qty 1

## 2024-06-28 MED ORDER — ENOXAPARIN SODIUM 40 MG/0.4ML IJ SOSY
40.0000 mg | PREFILLED_SYRINGE | INTRAMUSCULAR | Status: DC
  Administered 2024-06-29: 40 mg via SUBCUTANEOUS
  Filled 2024-06-28: qty 0.4

## 2024-06-28 MED ORDER — OXYCODONE HCL 5 MG PO TABS: 5.0000 mg | ORAL_TABLET | Freq: Once | ORAL | Status: DC | PRN

## 2024-06-28 MED ORDER — DEXAMETHASONE SOD PHOSPHATE PF 10 MG/ML IJ SOLN
INTRAMUSCULAR | Status: DC | PRN
  Administered 2024-06-28: 10 mg via INTRAVENOUS

## 2024-06-28 MED ORDER — SUGAMMADEX SODIUM 200 MG/2ML IV SOLN
INTRAVENOUS | Status: AC
  Filled 2024-06-28: qty 2

## 2024-06-28 MED ORDER — AMLODIPINE BESYLATE 5 MG PO TABS
5.0000 mg | ORAL_TABLET | Freq: Every day | ORAL | Status: DC
  Administered 2024-06-29: 5 mg via ORAL
  Filled 2024-06-28: qty 1

## 2024-06-28 MED ORDER — ACETAMINOPHEN 10 MG/ML IV SOLN: 1000.0000 mg | Freq: Once | INTRAVENOUS | Status: DC | PRN

## 2024-06-28 MED ORDER — METOPROLOL TARTRATE 5 MG/5ML IV SOLN: 5.0000 mg | Freq: Four times a day (QID) | INTRAVENOUS | Status: DC | PRN

## 2024-06-28 MED ORDER — SIMETHICONE 80 MG PO CHEW
40.0000 mg | CHEWABLE_TABLET | Freq: Four times a day (QID) | ORAL | Status: DC | PRN
  Administered 2024-06-29: 40 mg via ORAL
  Filled 2024-06-28: qty 1

## 2024-06-28 MED ORDER — BUPIVACAINE-EPINEPHRINE 0.25% -1:200000 IJ SOLN
INTRAMUSCULAR | Status: DC | PRN
  Administered 2024-06-28: 30 mL

## 2024-06-28 MED ORDER — ACETAMINOPHEN 500 MG PO TABS
1000.0000 mg | ORAL_TABLET | ORAL | Status: AC
  Administered 2024-06-28: 1000 mg via ORAL
  Filled 2024-06-28: qty 2

## 2024-06-28 MED ORDER — ROCURONIUM BROMIDE 10 MG/ML (PF) SYRINGE
PREFILLED_SYRINGE | INTRAVENOUS | Status: DC | PRN
  Administered 2024-06-28: 60 mg via INTRAVENOUS

## 2024-06-28 MED ORDER — FENTANYL CITRATE (PF) 50 MCG/ML IJ SOSY
PREFILLED_SYRINGE | INTRAMUSCULAR | Status: AC
  Filled 2024-06-28: qty 2

## 2024-06-28 MED ORDER — BUPIVACAINE-EPINEPHRINE (PF) 0.25% -1:200000 IJ SOLN
INTRAMUSCULAR | Status: AC
  Filled 2024-06-28: qty 30

## 2024-06-28 MED ORDER — CEFAZOLIN SODIUM-DEXTROSE 2-4 GM/100ML-% IV SOLN
2.0000 g | INTRAVENOUS | Status: AC
  Administered 2024-06-28: 2 g via INTRAVENOUS
  Filled 2024-06-28: qty 100

## 2024-06-28 MED ORDER — SODIUM CHLORIDE 0.45 % IV SOLN: INTRAVENOUS | Status: DC

## 2024-06-28 MED ORDER — ROCURONIUM BROMIDE 10 MG/ML (PF) SYRINGE
PREFILLED_SYRINGE | INTRAVENOUS | Status: AC
  Filled 2024-06-28: qty 10

## 2024-06-28 MED ORDER — ENSURE SURGERY PO LIQD
237.0000 mL | Freq: Two times a day (BID) | ORAL | Status: DC
  Administered 2024-06-29: 237 mL via ORAL

## 2024-06-28 MED ORDER — TRAMADOL HCL 50 MG PO TABS
50.0000 mg | ORAL_TABLET | Freq: Four times a day (QID) | ORAL | Status: DC | PRN
  Administered 2024-06-28 – 2024-06-29 (×3): 50 mg via ORAL
  Filled 2024-06-28 (×3): qty 1

## 2024-06-28 MED ORDER — SUGAMMADEX SODIUM 200 MG/2ML IV SOLN
INTRAVENOUS | Status: DC | PRN
  Administered 2024-06-28: 180 mg via INTRAVENOUS

## 2024-06-28 MED ORDER — EPHEDRINE 5 MG/ML INJ
INTRAVENOUS | Status: AC
  Filled 2024-06-28: qty 5

## 2024-06-28 MED ORDER — ACETAMINOPHEN 500 MG PO TABS
1000.0000 mg | ORAL_TABLET | Freq: Four times a day (QID) | ORAL | Status: DC
  Administered 2024-06-28 – 2024-06-29 (×4): 1000 mg via ORAL
  Filled 2024-06-28 (×3): qty 2

## 2024-06-28 MED ORDER — MORPHINE SULFATE (PF) 2 MG/ML IV SOLN: 2.0000 mg | INTRAVENOUS | Status: DC | PRN

## 2024-06-28 MED ORDER — PROPOFOL 10 MG/ML IV BOLUS
INTRAVENOUS | Status: DC | PRN
  Administered 2024-06-28: 50 mg via INTRAVENOUS
  Administered 2024-06-28: 70 mg via INTRAVENOUS

## 2024-06-28 MED ORDER — ONDANSETRON HCL 4 MG/2ML IJ SOLN
INTRAMUSCULAR | Status: DC | PRN
  Administered 2024-06-28: 4 mg via INTRAVENOUS

## 2024-06-28 MED ORDER — 0.9 % SODIUM CHLORIDE (POUR BTL) OPTIME
TOPICAL | Status: DC | PRN
  Administered 2024-06-28: 1000 mL

## 2024-06-28 MED ORDER — LACTATED RINGERS IV SOLN: INTRAVENOUS | Status: DC

## 2024-06-28 MED ORDER — ONDANSETRON HCL 4 MG/2ML IJ SOLN
INTRAMUSCULAR | Status: AC
  Filled 2024-06-28: qty 2

## 2024-06-28 MED ORDER — LIDOCAINE HCL (PF) 2 % IJ SOLN
INTRAMUSCULAR | Status: AC
  Filled 2024-06-28: qty 5

## 2024-06-28 MED ORDER — EPHEDRINE SULFATE-NACL 50-0.9 MG/10ML-% IV SOSY
PREFILLED_SYRINGE | INTRAVENOUS | Status: DC | PRN
  Administered 2024-06-28 (×2): 5 mg via INTRAVENOUS

## 2024-06-28 MED ORDER — LIDOCAINE HCL (PF) 2 % IJ SOLN
INTRAMUSCULAR | Status: DC | PRN
  Administered 2024-06-28: 80 mg via INTRADERMAL

## 2024-06-28 MED ORDER — DIPHENHYDRAMINE HCL 12.5 MG/5ML PO ELIX: 12.5000 mg | ORAL_SOLUTION | Freq: Four times a day (QID) | ORAL | Status: DC | PRN

## 2024-06-28 NOTE — Transfer of Care (Signed)
 Immediate Anesthesia Transfer of Care Note  Patient: Sharon Callahan  Procedure(s) Performed: THYROIDECTOMY (Throat)  Patient Location: PACU  Anesthesia Type:General  Level of Consciousness: awake, alert , oriented, and patient cooperative  Airway & Oxygen Therapy: Patient Spontanous Breathing and Patient connected to face mask oxygen  Post-op Assessment: Report given to RN, Post -op Vital signs reviewed and stable, and Patient moving all extremities  Post vital signs: Reviewed and stable  Last Vitals:  Vitals Value Taken Time  BP 145/72 06/28/24 11:45  Temp 36.4 C 06/28/24 11:42  Pulse 79 06/28/24 11:47  Resp 20 06/28/24 11:47  SpO2 99 % 06/28/24 11:47  Vitals shown include unfiled device data.  Last Pain:  Vitals:   06/28/24 0707  TempSrc:   PainSc: 0-No pain         Complications: No notable events documented.

## 2024-06-28 NOTE — Anesthesia Preprocedure Evaluation (Signed)
"                                    Anesthesia Evaluation  Patient identified by MRN, date of birth, ID band Patient awake    Reviewed: Allergy & Precautions, NPO status , Patient's Chart, lab work & pertinent test results  History of Anesthesia Complications Negative for: history of anesthetic complications  Airway Mallampati: III  TM Distance: >3 FB Neck ROM: Full    Dental  (+) Teeth Intact, Dental Advisory Given   Pulmonary neg shortness of breath, neg sleep apnea, neg COPD, neg recent URI   breath sounds clear to auscultation       Cardiovascular hypertension, Pt. on medications (-) angina (-) Past MI and (-) CHF  Rhythm:Regular     Neuro/Psych negative neurological ROS  negative psych ROS   GI/Hepatic negative GI ROS, Neg liver ROS,,,  Endo/Other  negative endocrine ROS  Thyroid  nodule  Renal/GU negative Renal ROS     Musculoskeletal negative musculoskeletal ROS (+)    Abdominal   Peds  Hematology negative hematology ROS (+)   Anesthesia Other Findings   Reproductive/Obstetrics                              Anesthesia Physical Anesthesia Plan  ASA: 2  Anesthesia Plan: General   Post-op Pain Management: Tylenol  PO (pre-op)*   Induction: Intravenous  PONV Risk Score and Plan: 4 or greater and Ondansetron  and Dexamethasone   Airway Management Planned: Oral ETT  Additional Equipment: None  Intra-op Plan:   Post-operative Plan: Extubation in OR  Informed Consent: I have reviewed the patients History and Physical, chart, labs and discussed the procedure including the risks, benefits and alternatives for the proposed anesthesia with the patient or authorized representative who has indicated his/her understanding and acceptance.     Dental advisory given  Plan Discussed with: CRNA  Anesthesia Plan Comments:          Anesthesia Quick Evaluation  "

## 2024-06-28 NOTE — Anesthesia Procedure Notes (Signed)
 Procedure Name: Intubation Date/Time: 06/28/2024 9:48 AM  Performed by: Leopoldo Bruckner, MDPre-anesthesia Checklist: Patient identified, Emergency Drugs available, Suction available and Patient being monitored Patient Re-evaluated:Patient Re-evaluated prior to induction Oxygen Delivery Method: Circle system utilized Preoxygenation: Pre-oxygenation with 100% oxygen Induction Type: IV induction Ventilation: Mask ventilation without difficulty Laryngoscope Size: Mac and 3 Grade View: Grade I Tube type: Oral Tube size: 7.0 mm Number of attempts: 1 Airway Equipment and Method: Stylet and Oral airway Placement Confirmation: ETT inserted through vocal cords under direct vision, positive ETCO2 and breath sounds checked- equal and bilateral Secured at: 19 cm Tube secured with: Tape Dental Injury: Teeth and Oropharynx as per pre-operative assessment  Difficulty Due To: Difficulty was anticipated Comments: MAC 3 grade 4 by CRNA, Mil 2 Grade 3 view by MDA. Due to short thyromental distance and limited neck extension visualization was impaired. Easy mask throughout. DL x 1 with glidescope grade 1 view. 7 ETT inserted.

## 2024-06-28 NOTE — Plan of Care (Signed)

## 2024-06-29 ENCOUNTER — Other Ambulatory Visit (HOSPITAL_COMMUNITY): Payer: Self-pay

## 2024-06-29 ENCOUNTER — Encounter (HOSPITAL_COMMUNITY): Payer: Self-pay | Admitting: General Surgery

## 2024-06-29 LAB — CBC
HCT: 40.7 % (ref 36.0–46.0)
Hemoglobin: 13.1 g/dL (ref 12.0–15.0)
MCH: 30.5 pg (ref 26.0–34.0)
MCHC: 32.2 g/dL (ref 30.0–36.0)
MCV: 94.9 fL (ref 80.0–100.0)
Platelets: 228 10*3/uL (ref 150–400)
RBC: 4.29 MIL/uL (ref 3.87–5.11)
RDW: 12.6 % (ref 11.5–15.5)
WBC: 12.7 10*3/uL — ABNORMAL HIGH (ref 4.0–10.5)
nRBC: 0 % (ref 0.0–0.2)

## 2024-06-29 LAB — BASIC METABOLIC PANEL WITH GFR
Anion gap: 10 (ref 5–15)
BUN: 9 mg/dL (ref 8–23)
CO2: 23 mmol/L (ref 22–32)
Calcium: 9 mg/dL (ref 8.9–10.3)
Chloride: 103 mmol/L (ref 98–111)
Creatinine, Ser: 0.76 mg/dL (ref 0.44–1.00)
GFR, Estimated: >60 mL/min
Glucose, Bld: 108 mg/dL — ABNORMAL HIGH (ref 70–99)
Potassium: 4.3 mmol/L (ref 3.5–5.1)
Sodium: 136 mmol/L (ref 135–145)

## 2024-06-29 MED ORDER — TRAMADOL HCL 50 MG PO TABS
50.0000 mg | ORAL_TABLET | Freq: Four times a day (QID) | ORAL | 0 refills | Status: AC | PRN
  Filled 2024-06-29: qty 10, 3d supply, fill #0

## 2024-06-29 MED ORDER — CALCIUM CARBONATE 1250 (500 CA) MG PO TABS
1.0000 | ORAL_TABLET | Freq: Two times a day (BID) | ORAL | 0 refills | Status: AC
  Filled 2024-06-29: qty 60, 30d supply, fill #0

## 2024-06-29 MED ORDER — LEVOTHYROXINE SODIUM 100 MCG PO TABS
100.0000 ug | ORAL_TABLET | Freq: Every day | ORAL | 2 refills | Status: AC
  Filled 2024-06-29: qty 60, 60d supply, fill #0

## 2024-06-29 NOTE — Discharge Instructions (Signed)
[facility]      [facility] [phone]  THYROID/ PARATHYROID SURGERY: POST OP INSTRUCTIONS  Always review your discharge instruction sheet given to you by the facility where your surgery was performed.  IF YOU HAVE DISABILITY OR FAMILY LEAVE FORMS, YOU MUST BRING THEM TO THE OFFICE FOR PROCESSING.  PLEASE DO NOT GIVE THEM TO YOUR DOCTOR.  A prescription for pain medication may be given to you upon discharge.  Take your pain medication as prescribed, if needed.  If narcotic pain medicine is not needed, then you may take acetaminophen (Tylenol) or ibuprofen (Advil) as needed. Take your usually prescribed medications unless otherwise directed. If you need a refill on your pain medication, please contact your pharmacy. They will contact our office to request authorization.  Prescriptions will not be filled after 5pm or on week-ends. You should follow a light diet the first 24 hours after arrival home, such as soup and crackers, etc.  Be sure to include lots of fluids daily.  Resume your normal diet the day after surgery. Most patients will experience some swelling and bruising on the chest and neck area.  Ice packs will help.  Swelling and bruising can take several days to resolve.  It is common to experience some constipation if taking pain medication after surgery.  Increasing fluid intake and taking a stool softener will usually help or prevent this problem from occurring.  A mild laxative (Milk of Magnesia or Miralax) should be taken according to package directions if there are no bowel movements after 48 hours. Unless discharge instructions indicate otherwise, you may remove your bandages 24-48 hours after surgery, and you may shower at that time.  You may have steri-strips (small skin tapes) in place directly over the incision.  These strips should be left on the skin for 7-10 days.  If your surgeon used skin glue on the incision, you may shower in 24 hours.  The glue will flake off over  the next 2-3 weeks.  Any sutures or staples will be removed at the office during your follow-up visit. ACTIVITIES:  You may resume regular (light) daily activities beginning the next day--such as daily self-care, walking, climbing stairs--gradually increasing activities as tolerated.  You may have sexual intercourse when it is comfortable.  Refrain from any heavy lifting or straining until approved by your doctor. You may drive when you no longer are taking prescription pain medication, you can comfortably wear a seatbelt, and you can safely maneuver your car and apply brakes RETURN TO WORK:  __________________________________________________________ You should see your doctor in the office for a follow-up appointment approximately two weeks after your surgery.  Make sure that you call for this appointment within a day or two after you arrive home to insure a convenient appointment time. OTHER INSTRUCTIONS: ____________________________________________________________________________ _________________________________________________________________________________________________________________ _________________________________________________________________________________________________________________   WHEN TO CALL YOUR DOCTOR: Fever over 101.0 Inability to urinate Nausea and/or vomiting Extreme swelling or bruising Continued bleeding from incision. Increased pain, redness, or drainage from the incision. Difficulty swallowing or breathing Muscle cramping or spasms. Numbness or tingling in hands or feet or around lips.  The clinic staff is available to answer your questions during regular business hours.  Please don't hesitate to call and ask to speak to one of the nurses if you have concerns.  For further questions, please visit [email or web address]  

## 2024-06-29 NOTE — Progress Notes (Signed)
 Discharge meds in a secure bag delivered to patient by this RN.  Patient and family verbalized an understanding that they would need to purchase the [place] today OTC.

## 2024-06-30 ENCOUNTER — Other Ambulatory Visit (HOSPITAL_BASED_OUTPATIENT_CLINIC_OR_DEPARTMENT_OTHER): Payer: Self-pay | Admitting: General Surgery

## 2024-06-30 ENCOUNTER — Ambulatory Visit (HOSPITAL_BASED_OUTPATIENT_CLINIC_OR_DEPARTMENT_OTHER)
Admission: RE | Admit: 2024-06-30 | Discharge: 2024-06-30 | Disposition: A | Source: Ambulatory Visit | Attending: General Surgery

## 2024-06-30 DIAGNOSIS — M79604 Pain in right leg: Secondary | ICD-10-CM

## 2024-07-02 LAB — SURGICAL PATHOLOGY
